# Patient Record
Sex: Female | Born: 1980 | Hispanic: No | Marital: Married | Smoking: Never smoker
Health system: Southern US, Community
[De-identification: ages and names within clinical notes are randomized; demographics above are authoritative.]

## PROBLEM LIST (undated history)

## (undated) DIAGNOSIS — Z789 Other specified health status: Secondary | ICD-10-CM

## (undated) DIAGNOSIS — G43909 Migraine, unspecified, not intractable, without status migrainosus: Secondary | ICD-10-CM

## (undated) DIAGNOSIS — I8390 Asymptomatic varicose veins of unspecified lower extremity: Secondary | ICD-10-CM

## (undated) DIAGNOSIS — R87629 Unspecified abnormal cytological findings in specimens from vagina: Secondary | ICD-10-CM

## (undated) HISTORY — DX: Migraine, unspecified, not intractable, without status migrainosus: G43.909

## (undated) HISTORY — PX: NO PAST SURGERIES: SHX2092

---

## 2007-09-18 ENCOUNTER — Emergency Department (HOSPITAL_COMMUNITY): Admission: EM | Admit: 2007-09-18 | Discharge: 2007-09-18 | Payer: Self-pay | Admitting: Emergency Medicine

## 2008-03-17 ENCOUNTER — Other Ambulatory Visit: Admission: RE | Admit: 2008-03-17 | Discharge: 2008-03-17 | Payer: Self-pay | Admitting: Family Medicine

## 2009-04-22 ENCOUNTER — Other Ambulatory Visit: Admission: RE | Admit: 2009-04-22 | Discharge: 2009-04-22 | Payer: Self-pay | Admitting: Family Medicine

## 2009-09-03 ENCOUNTER — Emergency Department (HOSPITAL_COMMUNITY): Admission: EM | Admit: 2009-09-03 | Discharge: 2009-09-03 | Payer: Self-pay | Admitting: Family Medicine

## 2010-02-18 ENCOUNTER — Emergency Department (HOSPITAL_COMMUNITY)
Admission: EM | Admit: 2010-02-18 | Discharge: 2010-02-18 | Payer: Self-pay | Source: Home / Self Care | Admitting: Family Medicine

## 2010-02-19 ENCOUNTER — Emergency Department (HOSPITAL_COMMUNITY)
Admission: EM | Admit: 2010-02-19 | Discharge: 2010-02-19 | Payer: Self-pay | Source: Home / Self Care | Admitting: Emergency Medicine

## 2012-05-20 ENCOUNTER — Other Ambulatory Visit (HOSPITAL_COMMUNITY): Payer: Self-pay | Admitting: Interventional Radiology

## 2012-05-20 DIAGNOSIS — I83813 Varicose veins of bilateral lower extremities with pain: Secondary | ICD-10-CM

## 2012-05-29 ENCOUNTER — Other Ambulatory Visit: Payer: Self-pay

## 2012-06-17 ENCOUNTER — Ambulatory Visit
Admission: RE | Admit: 2012-06-17 | Discharge: 2012-06-17 | Disposition: A | Discharge status: Home / Self Care | Payer: 59 | Source: Ambulatory Visit | Attending: Interventional Radiology | Admitting: Interventional Radiology

## 2012-06-17 DIAGNOSIS — I83813 Varicose veins of bilateral lower extremities with pain: Secondary | ICD-10-CM

## 2012-06-17 HISTORY — DX: Asymptomatic varicose veins of unspecified lower extremity: I83.90

## 2013-07-28 ENCOUNTER — Other Ambulatory Visit: Payer: Self-pay | Admitting: Family Medicine

## 2013-07-28 ENCOUNTER — Other Ambulatory Visit (HOSPITAL_COMMUNITY)
Admission: RE | Admit: 2013-07-28 | Discharge: 2013-07-28 | Disposition: A | Discharge status: Home / Self Care | Payer: 59 | Source: Ambulatory Visit | Attending: Family Medicine | Admitting: Family Medicine

## 2013-07-28 DIAGNOSIS — Z01419 Encounter for gynecological examination (general) (routine) without abnormal findings: Secondary | ICD-10-CM | POA: Insufficient documentation

## 2013-07-29 LAB — CYTOLOGY - PAP

## 2013-10-09 ENCOUNTER — Other Ambulatory Visit: Payer: Self-pay | Admitting: Family Medicine

## 2013-10-09 ENCOUNTER — Ambulatory Visit
Admission: RE | Admit: 2013-10-09 | Discharge: 2013-10-09 | Disposition: A | Discharge status: Home / Self Care | Payer: 59 | Source: Ambulatory Visit | Attending: Family Medicine | Admitting: Family Medicine

## 2013-10-09 DIAGNOSIS — M25552 Pain in left hip: Secondary | ICD-10-CM

## 2014-05-15 IMAGING — US US RADIOLOGIST EVAL AND MGMT
1 series · 12 of 16 positions shown · non-contrast
Comparison: none

NEW PATIENT OFFICE VISIT - LEVEL III ([id number])

Chief Complaint:  Bilateral leg pain and swelling
HISTORY: Victorio Tafolla is a 31-year-old G1P1 female with an
approximately 10-year history of progressive bilateral lower
extremity pain, swelling and superficial venous changes.  She has
been wearing thigh-high compression hose since [id number] when her
symptoms worsened after having her first child.  She was evaluated
several years ago (March 2008) by [facility]. Per her prior notes, she was previously found to have
no significant insufficiency in the great or small saphenous veins
and no significant incompetent perforators.

[Series 1: us radiologist eval and mgmt · 12 of 70 slices shown]
[im 1/70]
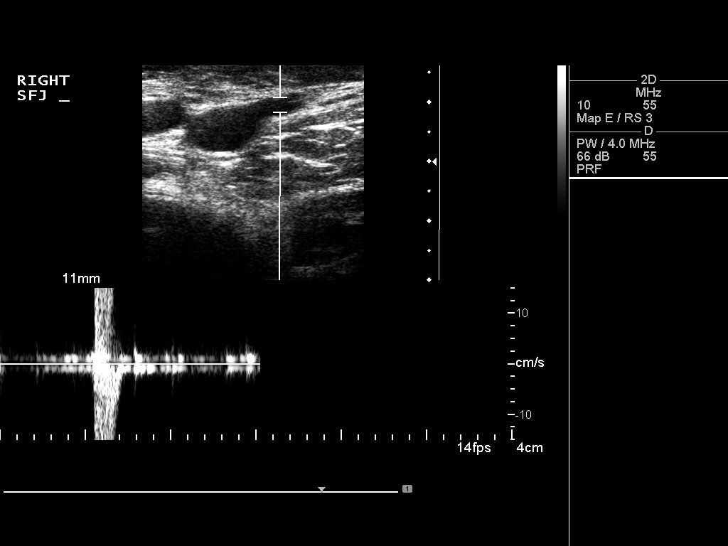
[im 10/70]
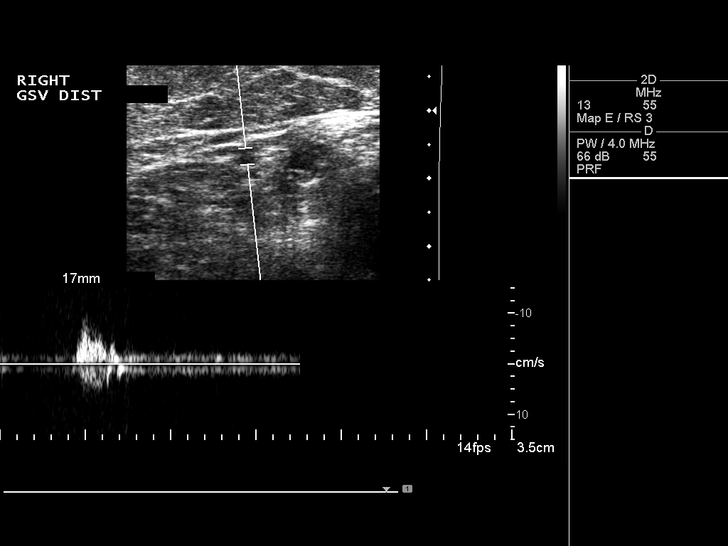
[im 14/70]
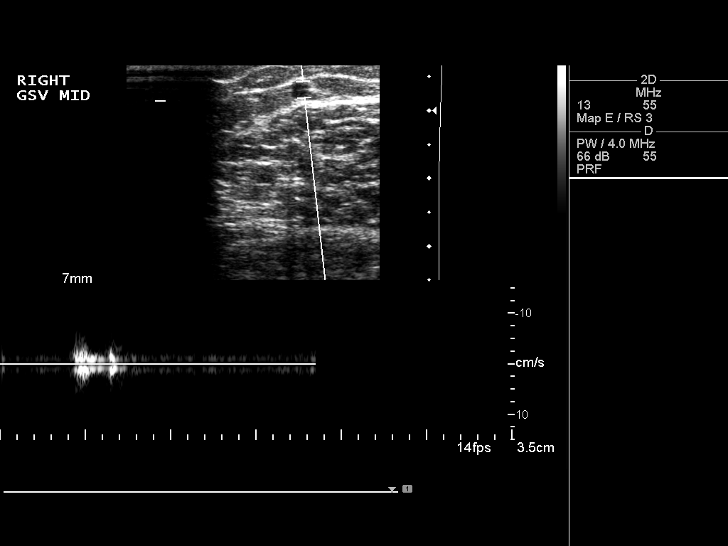
[im 19/70]
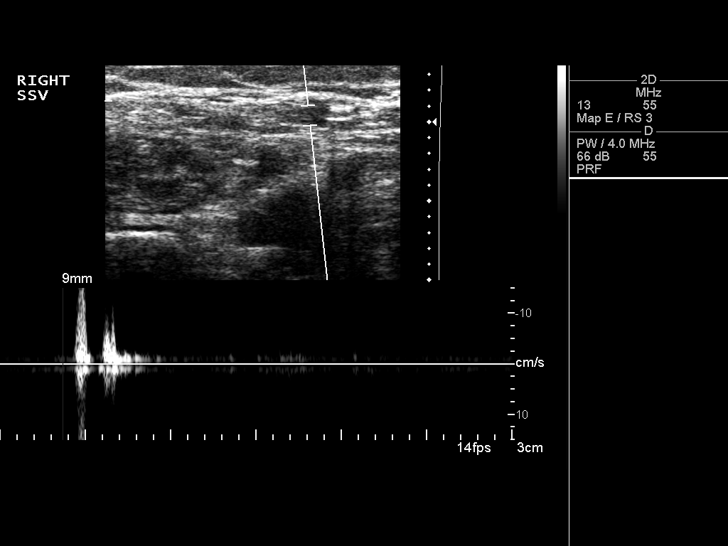
[im 28/70]
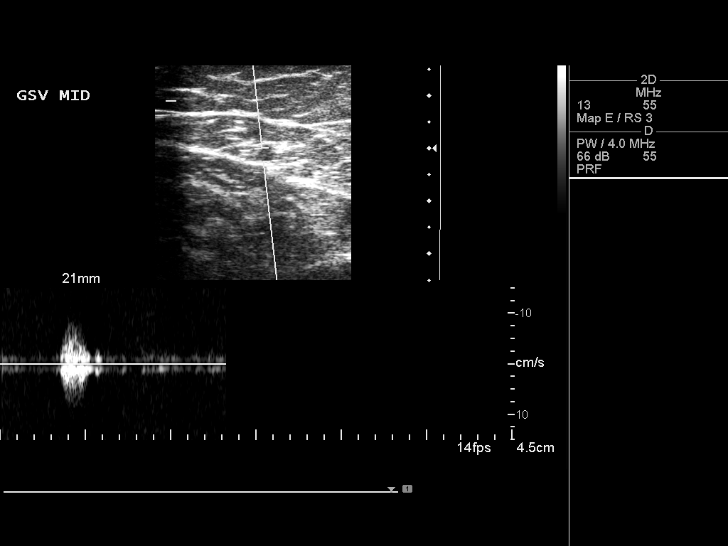
[im 33/70]
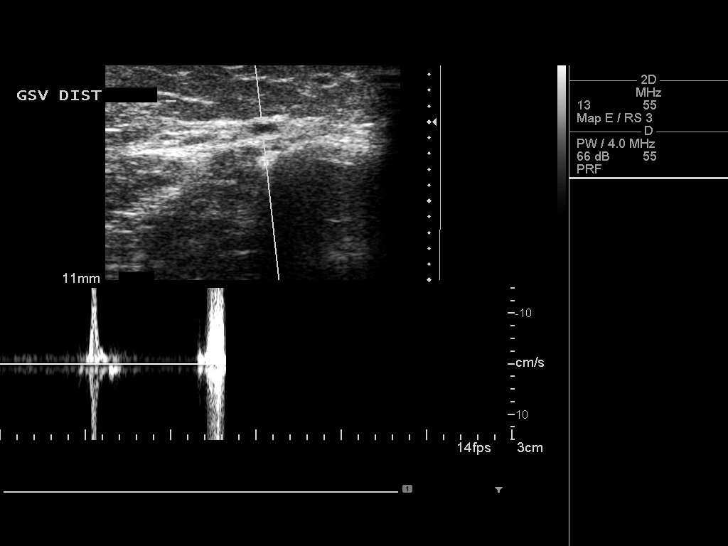
[im 37/70]
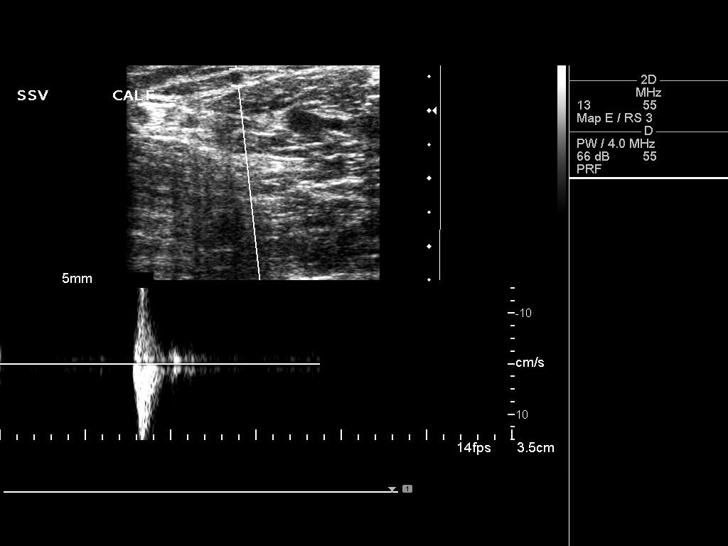
[im 47/70]
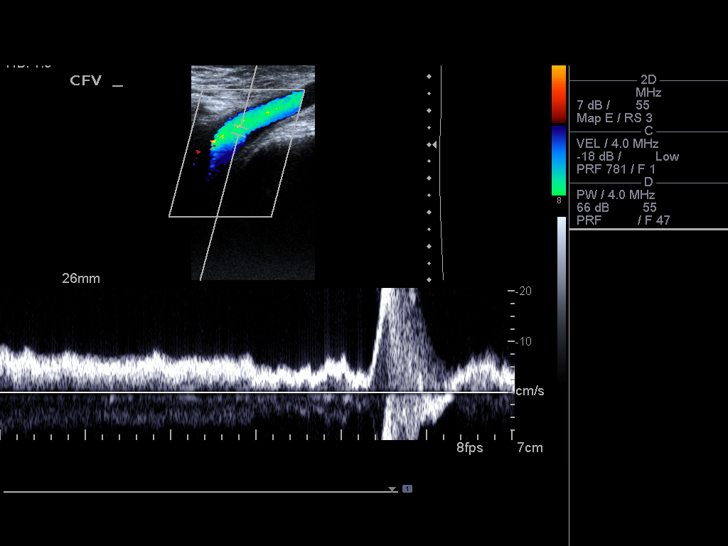
[im 51/70]
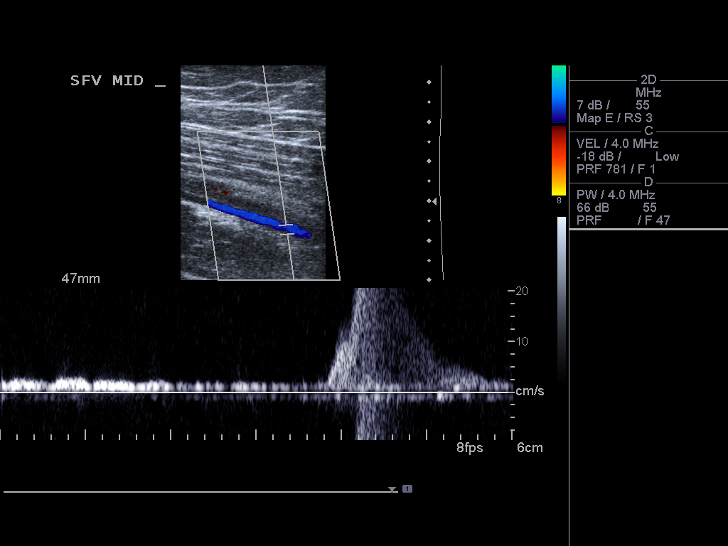
[im 56/70]
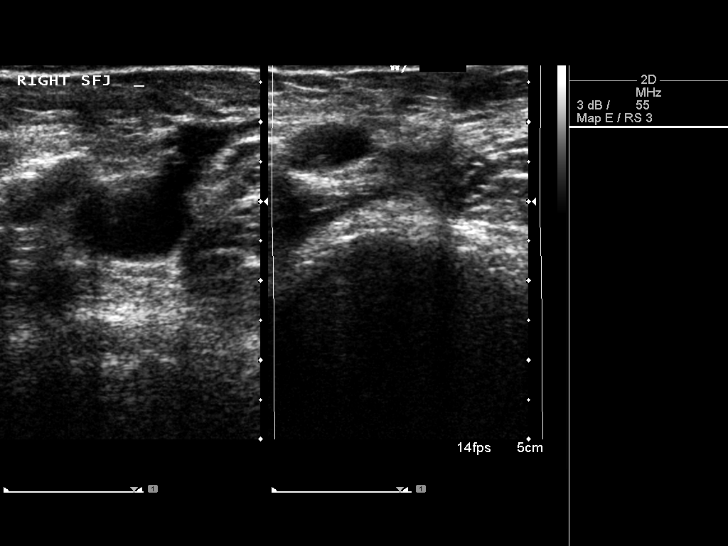
[im 65/70]
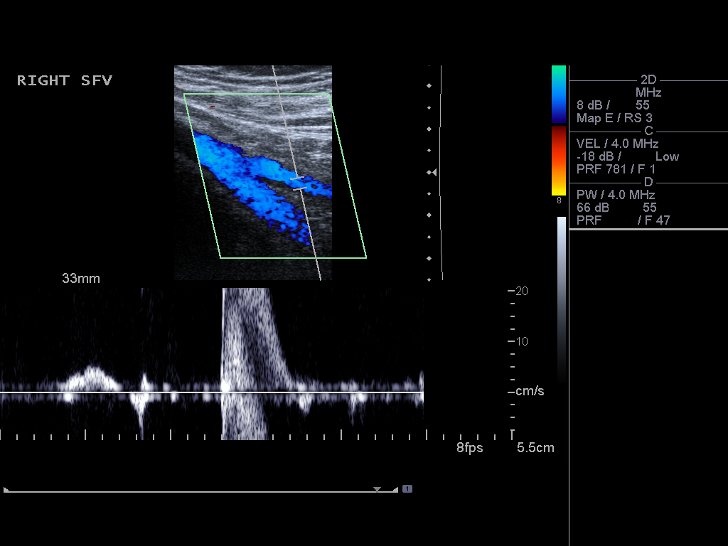
[im 70/70]
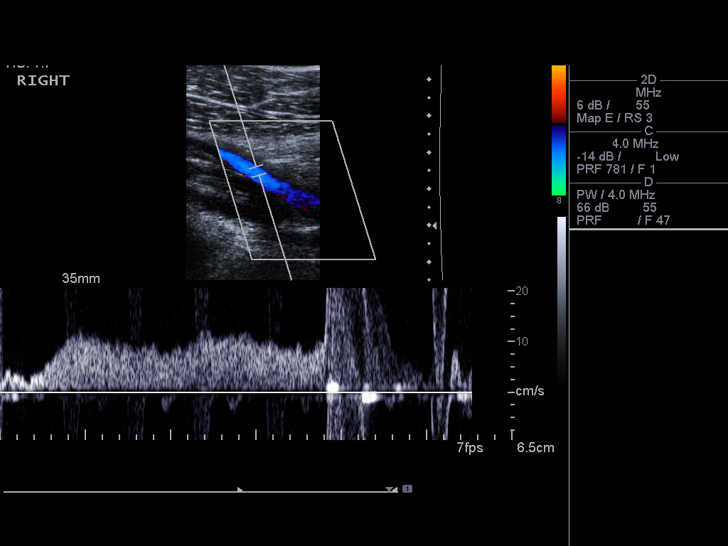

[12 of 16 positions shown; findings below may reference images not displayed]

She presents today for reevaluation.  Her symptoms have slightly
progressed despite daily use of 30-40 mmHg compression hose and
elevation of the lower extremities at the end of the day.  She
describes intermittent sharp stinging pains which blossom into a
burning/aching sensation.  This is accompanied by feelings of
heaviness and fatigue. She reports occasional mild edema at the
level of the ankles.  Her symptoms are localized to the posterior
knee and lower legs, and are worse near the end of the day,
particularly after prolonged periods of standing or activity.  She
takes 800 mg of ibuprofen which she takes twice a day at least two
to three times per week. The ibuprofen provides some degree of
relief.  She finds the compression hose to be minimally effective
at relieving her symptoms. She denies any additional prior venous
directed therapy.  She denies symptoms of pelvic congestion.

Past Medical History:  No significant past medical history

Medications:  Ibuprofen 800 mg twice a day taken 2-3 days per week

Allergies:  Augmentin, codeine, azithromycin

Social History:  Single G1,P1 female.  She currently lives in
[HOSPITAL] and works at the [REDACTED] [employer].  She drinks approximately two glasses of red wine weekly
and denies use of tobacco products or recreational drugs.  She does
not exercise as often as she would like due to her lower extremity
symptoms.

Family History: Her grandmother has a history of a varicose and
spider veins.

Review of Systems:  Pertinent positives listed in the history of
present illness above.  Otherwise, the 14 point review of systems
was negative.

Exam:

[DATE], heart rate 72, respiratory rate 14, temp 97.9 degrees
Fahrenheit, oxygen saturation 100% on room air
General:  Well-nourished well developed female in no acute distress
Neuro:  Alert oriented x3, normal affect
Cardiac:  Regular rate rhythm
Lower extremities:  2+ symmetric dorsalis pedis and posterior
tibial arterial pulses bilaterally.  Mild to moderate density of
clusters of spider veins along the anterior and lateral aspects of
her bilateral lower legs with more subtle changes in the distal
thigh. She has a single visible 3 - 4 mm reticular vein in the
right popliteal fossa. The areas of abnormal veins are minimally
tender to palpation. Trace edema bilaterally at the ankles. Duplex
ultrasonography demonstrates no significant venous insufficiency in
the crater or small saphenous veins bilaterally and no significant
incompetent perforator veins.  She has a vein of Bony on the
left which is competent.

Assessment and Plan:

31-year-old female with a long history of progressive lower
extremity pain, fatigue and mild the swelling associated with
superficial venous changes including spider veins and a prominent
reticular vein.  Her symptoms have been resistant to conventional
noninvasive therapies including bilateral thigh-high compression
hose, elevation and over-the-counter anti-inflammatories. Duplex
ultrasonography demonstrates no significant venous insufficiency in
the great or small saphenous veins bilaterally and no evidence of
incompetent perforator veins.

The degree of her symptoms is greater than expected for her
relatively mild venous changes in the absence of significant deep
or superficial reflux by duplex venous ultrasound.  While I could
certainly offer her visually directed sclerotherapy treatment for
her reticular and spider veins, I suspect that this would result in
a cosmetic improvement and may not relieve her significant
underlying symptoms.

I would like to send her to see Dr. Amazigh, the regional expert
in superficial venous disease, for a second opinion.

[REDACTED]

## 2014-08-20 LAB — OB RESULTS CONSOLE GC/CHLAMYDIA
Chlamydia: NEGATIVE
GC PROBE AMP, GENITAL: NEGATIVE

## 2014-08-20 LAB — OB RESULTS CONSOLE ABO/RH: RH Type: POSITIVE

## 2014-08-20 LAB — OB RESULTS CONSOLE RPR: RPR: NONREACTIVE

## 2014-08-20 LAB — OB RESULTS CONSOLE ANTIBODY SCREEN: ANTIBODY SCREEN: NEGATIVE

## 2014-08-20 LAB — OB RESULTS CONSOLE HEPATITIS B SURFACE ANTIGEN: Hepatitis B Surface Ag: NEGATIVE

## 2014-08-20 LAB — OB RESULTS CONSOLE HIV ANTIBODY (ROUTINE TESTING): HIV: NONREACTIVE

## 2014-08-20 LAB — OB RESULTS CONSOLE RUBELLA ANTIBODY, IGM: RUBELLA: IMMUNE

## 2015-03-03 LAB — OB RESULTS CONSOLE GBS: STREP GROUP B AG: POSITIVE

## 2015-03-18 ENCOUNTER — Encounter (HOSPITAL_COMMUNITY): Payer: Self-pay | Admitting: *Deleted

## 2015-03-18 ENCOUNTER — Telehealth (HOSPITAL_COMMUNITY): Payer: Self-pay | Admitting: *Deleted

## 2015-03-18 NOTE — Telephone Encounter (Signed)
Preadmission screen  

## 2015-03-25 ENCOUNTER — Inpatient Hospital Stay (HOSPITAL_COMMUNITY): Payer: 59 | Admitting: Anesthesiology

## 2015-03-25 ENCOUNTER — Encounter (HOSPITAL_COMMUNITY): Payer: Self-pay

## 2015-03-25 ENCOUNTER — Inpatient Hospital Stay (HOSPITAL_COMMUNITY)
Admission: RE | Admit: 2015-03-25 | Discharge: 2015-03-26 | DRG: 774 | Disposition: A | Discharge status: Home / Self Care | Payer: 59 | Source: Ambulatory Visit | Attending: Obstetrics and Gynecology | Admitting: Obstetrics and Gynecology

## 2015-03-25 DIAGNOSIS — O99214 Obesity complicating childbirth: Secondary | ICD-10-CM | POA: Diagnosis present

## 2015-03-25 DIAGNOSIS — Z3A39 39 weeks gestation of pregnancy: Secondary | ICD-10-CM | POA: Diagnosis not present

## 2015-03-25 DIAGNOSIS — E669 Obesity, unspecified: Secondary | ICD-10-CM | POA: Diagnosis present

## 2015-03-25 DIAGNOSIS — O3663X0 Maternal care for excessive fetal growth, third trimester, not applicable or unspecified: Secondary | ICD-10-CM | POA: Diagnosis present

## 2015-03-25 DIAGNOSIS — O9832 Other infections with a predominantly sexual mode of transmission complicating childbirth: Secondary | ICD-10-CM | POA: Diagnosis present

## 2015-03-25 DIAGNOSIS — A6000 Herpesviral infection of urogenital system, unspecified: Secondary | ICD-10-CM | POA: Diagnosis present

## 2015-03-25 DIAGNOSIS — Z3490 Encounter for supervision of normal pregnancy, unspecified, unspecified trimester: Secondary | ICD-10-CM

## 2015-03-25 DIAGNOSIS — Z6832 Body mass index (BMI) 32.0-32.9, adult: Secondary | ICD-10-CM | POA: Diagnosis not present

## 2015-03-25 HISTORY — DX: Other specified health status: Z78.9

## 2015-03-25 HISTORY — DX: Unspecified abnormal cytological findings in specimens from vagina: R87.629

## 2015-03-25 LAB — CBC
HCT: 35.6 % — ABNORMAL LOW (ref 36.0–46.0)
Hemoglobin: 11.9 g/dL — ABNORMAL LOW (ref 12.0–15.0)
MCH: 27.1 pg (ref 26.0–34.0)
MCHC: 33.4 g/dL (ref 30.0–36.0)
MCV: 81.1 fL (ref 78.0–100.0)
PLATELETS: 298 10*3/uL (ref 150–400)
RBC: 4.39 MIL/uL (ref 3.87–5.11)
RDW: 16.2 % — AB (ref 11.5–15.5)
WBC: 7.7 10*3/uL (ref 4.0–10.5)

## 2015-03-25 LAB — TYPE AND SCREEN
ABO/RH(D): A POS
Antibody Screen: NEGATIVE

## 2015-03-25 LAB — ABO/RH: ABO/RH(D): A POS

## 2015-03-25 MED ORDER — TRAMADOL HCL 50 MG PO TABS: 50.0000 mg | ORAL_TABLET | Freq: Four times a day (QID) | ORAL | Status: DC | PRN

## 2015-03-25 MED ORDER — FLEET ENEMA 7-19 GM/118ML RE ENEM: 1.0000 | ENEMA | RECTAL | Status: DC | PRN

## 2015-03-25 MED ORDER — BENZOCAINE-MENTHOL 20-0.5 % EX AERO: 1.0000 "application " | INHALATION_SPRAY | CUTANEOUS | Status: DC | PRN

## 2015-03-25 MED ORDER — SENNOSIDES-DOCUSATE SODIUM 8.6-50 MG PO TABS
2.0000 | ORAL_TABLET | ORAL | Status: DC
  Administered 2015-03-26: 2 via ORAL
  Filled 2015-03-25: qty 2

## 2015-03-25 MED ORDER — CITRIC ACID-SODIUM CITRATE 334-500 MG/5ML PO SOLN: 30.0000 mL | ORAL | Status: DC | PRN

## 2015-03-25 MED ORDER — ONDANSETRON HCL 4 MG/2ML IJ SOLN
4.0000 mg | INTRAMUSCULAR | Status: DC | PRN
Start: 2015-03-25 — End: 2015-03-26

## 2015-03-25 MED ORDER — PENICILLIN G POTASSIUM 5000000 UNITS IJ SOLR
2.5000 10*6.[IU] | INTRAVENOUS | Status: DC
  Administered 2015-03-25: 2.5 10*6.[IU] via INTRAVENOUS
  Filled 2015-03-25 (×4): qty 2.5

## 2015-03-25 MED ORDER — LACTATED RINGERS IV SOLN: INTRAVENOUS | Status: DC

## 2015-03-25 MED ORDER — LACTATED RINGERS IV SOLN: 500.0000 mL | INTRAVENOUS | Status: DC | PRN

## 2015-03-25 MED ORDER — EPHEDRINE 5 MG/ML INJ
10.0000 mg | INTRAVENOUS | Status: DC | PRN
  Filled 2015-03-25: qty 2

## 2015-03-25 MED ORDER — DIBUCAINE 1 % RE OINT
1.0000 | TOPICAL_OINTMENT | RECTAL | Status: DC | PRN
Start: 2015-03-25 — End: 2015-03-26

## 2015-03-25 MED ORDER — OXYTOCIN 10 UNIT/ML IJ SOLN
1.0000 m[IU]/min | INTRAVENOUS | Status: DC
  Administered 2015-03-25: 2 m[IU]/min via INTRAVENOUS
  Filled 2015-03-25: qty 4

## 2015-03-25 MED ORDER — IBUPROFEN 600 MG PO TABS
600.0000 mg | ORAL_TABLET | Freq: Four times a day (QID) | ORAL | Status: DC
  Administered 2015-03-26 (×3): 600 mg via ORAL
  Filled 2015-03-25 (×3): qty 1

## 2015-03-25 MED ORDER — OXYTOCIN 10 UNIT/ML IJ SOLN: 2.5000 [IU]/h | INTRAVENOUS | Status: DC

## 2015-03-25 MED ORDER — DIPHENHYDRAMINE HCL 25 MG PO CAPS: 25.0000 mg | ORAL_CAPSULE | Freq: Four times a day (QID) | ORAL | Status: DC | PRN

## 2015-03-25 MED ORDER — PHENYLEPHRINE 40 MCG/ML (10ML) SYRINGE FOR IV PUSH (FOR BLOOD PRESSURE SUPPORT)
80.0000 ug | PREFILLED_SYRINGE | INTRAVENOUS | Status: DC | PRN
  Filled 2015-03-25: qty 2
  Filled 2015-03-25: qty 20

## 2015-03-25 MED ORDER — LIDOCAINE HCL (PF) 1 % IJ SOLN
30.0000 mL | INTRAMUSCULAR | Status: DC | PRN
  Filled 2015-03-25: qty 30

## 2015-03-25 MED ORDER — LACTATED RINGERS IV SOLN: 500.0000 mL | Freq: Once | INTRAVENOUS | Status: DC

## 2015-03-25 MED ORDER — BUTORPHANOL TARTRATE 1 MG/ML IJ SOLN: 1.0000 mg | INTRAMUSCULAR | Status: DC | PRN

## 2015-03-25 MED ORDER — FENTANYL 2.5 MCG/ML BUPIVACAINE 1/10 % EPIDURAL INFUSION (WH - ANES)
14.0000 mL/h | INTRAMUSCULAR | Status: DC | PRN
  Administered 2015-03-25: 14 mL/h via EPIDURAL
  Filled 2015-03-25: qty 125

## 2015-03-25 MED ORDER — OXYTOCIN BOLUS FROM INFUSION: 500.0000 mL | INTRAVENOUS | Status: DC

## 2015-03-25 MED ORDER — ONDANSETRON HCL 4 MG/2ML IJ SOLN: 4.0000 mg | Freq: Four times a day (QID) | INTRAMUSCULAR | Status: DC | PRN

## 2015-03-25 MED ORDER — SIMETHICONE 80 MG PO CHEW: 80.0000 mg | CHEWABLE_TABLET | ORAL | Status: DC | PRN

## 2015-03-25 MED ORDER — TERBUTALINE SULFATE 1 MG/ML IJ SOLN
0.2500 mg | Freq: Once | INTRAMUSCULAR | Status: DC | PRN
Start: 2015-03-25 — End: 2015-03-25
  Filled 2015-03-25: qty 1

## 2015-03-25 MED ORDER — PRENATAL MULTIVITAMIN CH
1.0000 | ORAL_TABLET | Freq: Every day | ORAL | Status: DC
  Administered 2015-03-26: 1 via ORAL
  Filled 2015-03-25: qty 1

## 2015-03-25 MED ORDER — ZOLPIDEM TARTRATE 5 MG PO TABS: 5.0000 mg | ORAL_TABLET | Freq: Every evening | ORAL | Status: DC | PRN

## 2015-03-25 MED ORDER — ACETAMINOPHEN 325 MG PO TABS: 650.0000 mg | ORAL_TABLET | ORAL | Status: DC | PRN

## 2015-03-25 MED ORDER — CLINDAMYCIN PHOSPHATE 900 MG/50ML IV SOLN: 900.0000 mg | Freq: Three times a day (TID) | INTRAVENOUS | Status: DC

## 2015-03-25 MED ORDER — WITCH HAZEL-GLYCERIN EX PADS: 1.0000 "application " | MEDICATED_PAD | CUTANEOUS | Status: DC | PRN

## 2015-03-25 MED ORDER — ONDANSETRON HCL 4 MG PO TABS: 4.0000 mg | ORAL_TABLET | ORAL | Status: DC | PRN

## 2015-03-25 MED ORDER — TETANUS-DIPHTH-ACELL PERTUSSIS 5-2.5-18.5 LF-MCG/0.5 IM SUSP
0.5000 mL | Freq: Once | INTRAMUSCULAR | Status: DC
  Filled 2015-03-25: qty 0.5

## 2015-03-25 MED ORDER — PHENYLEPHRINE 40 MCG/ML (10ML) SYRINGE FOR IV PUSH (FOR BLOOD PRESSURE SUPPORT)
80.0000 ug | PREFILLED_SYRINGE | INTRAVENOUS | Status: DC | PRN
  Filled 2015-03-25: qty 2

## 2015-03-25 MED ORDER — LIDOCAINE HCL (PF) 1 % IJ SOLN
INTRAMUSCULAR | Status: DC | PRN
  Administered 2015-03-25 (×2): 4 mL

## 2015-03-25 MED ORDER — DEXTROSE 5 % IV SOLN
5.0000 10*6.[IU] | Freq: Once | INTRAVENOUS | Status: AC
  Administered 2015-03-25: 5 10*6.[IU] via INTRAVENOUS
  Filled 2015-03-25: qty 5

## 2015-03-25 MED ORDER — LANOLIN HYDROUS EX OINT: TOPICAL_OINTMENT | CUTANEOUS | Status: DC | PRN

## 2015-03-25 MED ORDER — DIPHENHYDRAMINE HCL 50 MG/ML IJ SOLN: 12.5000 mg | INTRAMUSCULAR | Status: DC | PRN

## 2015-03-25 NOTE — Anesthesia Procedure Notes (Signed)
Epidural Patient location during procedure: OB  Staffing Anesthesiologist: Traniya Prichett Performed by: anesthesiologist   Preanesthetic Checklist Completed: patient identified, site marked, surgical consent, pre-op evaluation, timeout performed, IV checked, risks and benefits discussed and monitors and equipment checked  Epidural Patient position: sitting Prep: site prepped and draped and DuraPrep Patient monitoring: continuous pulse ox and blood pressure Approach: midline Location: L3-L4 Injection technique: LOR saline  Needle:  Needle type: Tuohy  Needle gauge: 17 G Needle length: 9 cm and 9 Needle insertion depth: 5 cm cm Catheter type: closed end flexible Catheter size: 19 Gauge Catheter at skin depth: 10 cm Test dose: negative  Assessment Events: blood not aspirated, injection not painful, no injection resistance, negative IV test and no paresthesia  Additional Notes Patient identified. Risks/Benefits/Options discussed with patient including but not limited to bleeding, infection, nerve damage, paralysis, failed block, incomplete pain control, headache, blood pressure changes, nausea, vomiting, reactions to medication both or allergic, itching and postpartum back pain. Confirmed with bedside nurse the patient's most recent platelet count. Confirmed with patient that they are not currently taking any anticoagulation, have any bleeding history or any family history of bleeding disorders. Patient expressed understanding and wished to proceed. All questions were answered. Sterile technique was used throughout the entire procedure. Please see nursing notes for vital signs. Test dose was given through epidural catheter and negative prior to continuing to dose epidural or start infusion. Warning signs of high block given to the patient including shortness of breath, tingling/numbness in hands, complete motor block, or any concerning symptoms with instructions to call for help. Patient was  given instructions on fall risk and not to get out of bed. All questions and concerns addressed with instructions to call with any issues or inadequate analgesia.      

## 2015-03-25 NOTE — Consults (Signed)
  Anesthesia Pain Consult Note  Patient: Samantha Trujillo, 35 y.o., female  Consult Requested by: Harold Hedge, MD  Reason for Consult: Epidural  Rounding  Level of Consciousness: alert  Current Pain Score: 0/10 Goal Score:5/10 in active labor.  Last Vitals:  Filed Vitals:   03/25/15 1102  BP: 140/76  Pulse: 79  Temp: 36.5 C  Resp: 16    Plan: Epidural infusion for pain control.      Allergies  Allergen Reactions  . Augmentin [Amoxicillin-Pot Clavulanate] Other (See Comments)    dizzy  . Codeine Rash  . Zithromax [Azithromycin] Rash     I have reviewed the patient's medications listed below.  . lactated ringers  500 mL Intravenous Once  . pencillin G potassium IV  5 Million Units Intravenous Once   Followed by  . pencillin G potassium IV  2.5 Million Units Intravenous Q4H   Pending:: . fentaNYL 2.5 mcg/ml w/bupivacaine 1/10% in NS epidural infusion ( total)    . lactated ringers    . oxytocin    . oxytocin 2 milli-units/min (03/25/15 1103)  . oxytocin 40 units in LR 1000 mL     acetaminophen, butorphanol, citric acid-sodium citrate, diphenhydrAMINE, ePHEDrine, ePHEDrine, fentaNYL 2.5 mcg/ml w/bupivacaine 1/10% in NS epidural infusion ( total), lactated ringers, lidocaine (PF), ondansetron, phenylephrine, phenylephrine, sodium phosphate, terbutaline  Past Medical History  Diagnosis Date  . Varicose veins    Past Surgical History  Procedure Laterality Date  . No past surgeries      has no tobacco, alcohol, and drug history on file.    Fulton Merry 03/25/2015

## 2015-03-25 NOTE — Anesthesia Preprocedure Evaluation (Signed)

## 2015-03-25 NOTE — H&P (Signed)
Samantha Trujillo is a 35 y.o. female presenting for IOL with EFW 8# 11oz on 03/16/15. 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 Maternal Medical History:  Fetal activity: Perceived fetal activity is normal.      OB History    Gravida Para Term Preterm AB TAB SAB Ectopic Multiple Living   1              Past Medical History  Diagnosis Date  . Varicose veins   . Vaginal Pap smear, abnormal   . Medical history non-contributory    Past Surgical History  Procedure Laterality Date  . No past surgeries     Family History: family history is not on file. Social History:  reports that she has never smoked. She does not have any smokeless tobacco history on file. She reports that she does not drink alcohol or use illicit drugs.   Prenatal Transfer Tool  Maternal Diabetes: No Genetic Screening: Normal Maternal Ultrasounds/Referrals: Normal Fetal Ultrasounds or other Referrals:  None Maternal Substance Abuse:  No Significant Maternal Medications:  Meds include: Other: Valtrex Significant Maternal Lab Results:  None Other Comments:  None  Review of Systems  Eyes: Negative for blurred vision.  Gastrointestinal: Negative for abdominal pain.  Neurological: Negative for headaches.    Dilation: 3.5 Effacement (%): 50 Station: -2 Exam by:: Alazne Quant Blood pressure 138/83, pulse 68, temperature 97.7 F (36.5 C), temperature source Axillary, resp. rate 16, last menstrual period 06/25/2014. Maternal Exam:  Abdomen: Fetal presentation: vertex     Fetal Exam Fetal State Assessment: Category I - tracings are normal.     Physical Exam  Cardiovascular: Normal rate and regular rhythm.   Respiratory: Effort normal and breath sounds normal.  GI: Soft. There is no tenderness.  Neurological: She has normal reflexes.    Cx 3/80/-2/vtx AROM clear  Prenatal labs: ABO, Rh: --/--/A POS (02/24 1030) Antibody: NEG (02/24 1030) Rubella:  Immune (07/22 0000) RPR: Nonreactive (07/22 0000)  HBsAg: Negative (07/22 0000)  HIV: Non-reactive (07/22 0000)  GBS: Positive (02/02 0000)   Assessment/Plan: 35 yo with LGA D/W patient active phase labor Epidural prn   Patric Buckhalter II,Fatema Rabe E 03/25/2015, 12:54 PM

## 2015-03-25 NOTE — Progress Notes (Signed)
4/90/-1 UCs q2-3 min Epidural in FHT decreased accels  FSE and IUPC placed Good scalp stim with FSE

## 2015-03-25 NOTE — Progress Notes (Signed)
Delivery Note At 6:32 PM a viable female was delivered via Vaginal, Spontaneous Delivery (Presentation: ;  ).  APGAR: 9, 9; weight  .   Placenta status: Intact, Spontaneous.  Cord: 3 vessels with the following complications: None.  Cord pH: not done  Anesthesia: Epidural  Episiotomy: None Lacerations: 2nd degree Suture Repair: 2.0 vicryl rapide Est. Blood Loss (mL): 100  Mom to postpartum.  Baby to Couplet care / Skin to Skin.  Montasia Chisenhall II,Rahaf Carbonell E 03/25/2015, 6:44 PM

## 2015-03-26 ENCOUNTER — Ambulatory Visit: Payer: Self-pay

## 2015-03-26 LAB — CBC
HEMATOCRIT: 32.7 % — AB (ref 36.0–46.0)
HEMOGLOBIN: 10.9 g/dL — AB (ref 12.0–15.0)
MCH: 27.3 pg (ref 26.0–34.0)
MCHC: 33.3 g/dL (ref 30.0–36.0)
MCV: 82 fL (ref 78.0–100.0)
Platelets: 249 10*3/uL (ref 150–400)
RBC: 3.99 MIL/uL (ref 3.87–5.11)
RDW: 16.2 % — ABNORMAL HIGH (ref 11.5–15.5)
WBC: 10.9 10*3/uL — AB (ref 4.0–10.5)

## 2015-03-26 LAB — RPR: RPR Ser Ql: NONREACTIVE

## 2015-03-26 MED ORDER — IBUPROFEN 600 MG PO TABS: 600.0000 mg | ORAL_TABLET | Freq: Four times a day (QID) | ORAL | Status: DC | PRN

## 2015-03-26 MED ORDER — BENZOCAINE-MENTHOL 20-0.5 % EX AERO: 1.0000 "application " | INHALATION_SPRAY | Freq: Three times a day (TID) | CUTANEOUS | Status: DC | PRN

## 2015-03-26 NOTE — Anesthesia Postprocedure Evaluation (Signed)
Anesthesia Post Note  Patient: Samantha Trujillo  Procedure(s) Performed: * No procedures listed *  Patient location during evaluation: Mother Baby Anesthesia Type: Epidural Level of consciousness: awake and alert and oriented Pain management: satisfactory to patient Vital Signs Assessment: post-procedure vital signs reviewed and stable Respiratory status: spontaneous breathing and nonlabored ventilation Cardiovascular status: stable Postop Assessment: no headache, no backache, no signs of nausea or vomiting, adequate PO intake and patient able to bend at knees (patient up walking) Anesthetic complications: no    Last Vitals:  Filed Vitals:   03/25/15 2127 03/26/15 0107  BP: 138/70 132/70  Pulse: 89 78  Temp: 36.9 C 36.8 C  Resp: 18 18    Last Pain:  Filed Vitals:   03/26/15 0532  PainSc: 0-No pain                 Aailyah Dunbar

## 2015-03-26 NOTE — Discharge Summary (Signed)
Obstetric Discharge Summary Reason for Admission: induction of labor Prenatal Procedures: none Intrapartum Procedures: spontaneous vaginal delivery Postpartum Procedures: none Complications-Operative and Postpartum: none HEMOGLOBIN  Date Value Ref Range Status  03/26/2015 10.9* 12.0 - 15.0 g/dL Final   HCT  Date Value Ref Range Status  03/26/2015 32.7* 36.0 - 46.0 % Final    Physical Exam:  General: alert, cooperative and no distress Lochia: appropriate Uterine Fundus: firm Incision: healing well DVT Evaluation: No evidence of DVT seen on physical exam.  Discharge Diagnoses: Term Pregnancy-delivered  Discharge Information: Date: 03/26/2015 Activity: pelvic rest Diet: routine Medications: PNV Condition: stable Instructions: refer to practice specific booklet Discharge to: home   Newborn Data: Live born female  Birth Weight: 8 lb 8.9 oz (3880 g) APGAR: 9, 9  Home with mother.  Lorik Guo II,Quetzalli Clos E 03/26/2015, 8:35 AM

## 2015-03-26 NOTE — Lactation Note (Signed)
This note was copied from a baby's chart. Lactation Consultation Note  Patient Name: Samantha Trujillo [id number] Date: 03/26/2015 Reason for consult: Follow-up assessment;Other (Comment)  Mom called for [facility] assist . Prior to latching baby spitty , clear thin mucous, LC used bulb syringe.  Baby checked diaper, dry , baby passing large amount gas.  LC noted when baby crying , spitting , a high palate , short anterior frenulum with indentation in baby's tongue. LC did not mention the short anterior frenulum to the parents, just the high palate for latching.  LC explained hand expressing and then showed mom how to hand express with few drops of colostrum noted.  LC noted a short shaft nipple, compressible areolo. With firm support, using the football position latched the baby  And breast compressions , a few swallows noted and strong sucking pattern .  For approx and mom asked to remove baby due to the latch being to painful.  LC explained option :  Recommendations -   1st option latching skin to skin and it was to painful  2nd option would be trying a NS - mom receptive , ( #20 NS fit the best compared to the #24 NS ),  Baby latched in football position for approx 1 min and per mom please take her off.  3rd option - pump using the DEBP both breast 15-20 mins every 2-3 hours and mom receptive.  LC set up the DEBP and explained it to mom and turned it on the one drop in the window of the pump and mom  asked to take it off to painful ( #24 Flange gave mom plenty of room and noted to be a good fit ). LC mentioned to mom if dad coconut oil or olive oil on the flange would decrease the friction of the pump and  Soften the tissue for pumping. LC suggested dad  Go to the store to purchase some.  LC instructed mom on the use comfort gels.  LC reassured mom  The staff is here to support her how ever she chooses to feed her baby.  LC recommended if the baby gets spitty and choky and  using the  bulb syringe and burping doesn't resolve it to use the emergency light on the nurses light.  [facility] RN Rodrigo Ran aware of the results of the consult and aware the baby had been very spitty during consult.        Maternal Data Has patient been taught Hand Expression?: Yes (small drops of colostrum noted ) Does the patient have breastfeeding experience prior to this delivery?: Yes  Feeding Feeding Type: Breast Fed Length of feed: 1 min (per mom please take her  off to painful )  LATCH Score/Interventions Latch: Grasps breast easily, tongue down, lips flanged, rhythmical sucking.  Audible Swallowing: A few with stimulation  Type of Nipple: Everted at rest and after stimulation (short shaft nipple )  Comfort (Breast/Nipple): Soft / non-tender     Hold (Positioning): Full assist, staff holds infant at breast Intervention(s): Breastfeeding basics reviewed  LATCH Score: 7  Lactation Tools Discussed/Used Tools: Pump Nipple shield size: 20;24;Other (comment) (#20 NS was a better, ) Breast pump type: Double-Electric Breast Pump (see note )   Consult Status Consult Status: Follow-up Date: 03/27/15 Follow-up type: In-patient    Kathrin Greathouse 03/26/2015, 7:08 PM

## 2015-03-26 NOTE — Lactation Note (Signed)
This note was copied from a baby's chart. Lactation Consultation Note  Patient Name: Samantha Trujillo [id number] Date: 03/26/2015 Per mom the baby last fed at 0930 for 30 mins , presently skin to skin with mom and not showing feeding cues.  Baby is 74 hours old and has been to the breast with breast range 8 - 35 mins. Voiding and stooling.  LC updated doc flow sheets and asked mom to call with feeding cues.  Mother informed of post-discharge support and given phone number to the lactation department, including services for phone call assistance; out-patient appointments; and breastfeeding support group. List of other breastfeeding resources in the community given in the handout. Encouraged mother to call for problems or concerns related to breastfeeding.  Maternal Data    Feeding    LATCH Score/Interventions                      Lactation Tools Discussed/Used     Consult Status Consult Status: Follow-up Date: 03/26/15 Follow-up type: In-patient    Kathrin Greathouse 03/26/2015, 1:22 PM

## 2015-03-26 NOTE — Progress Notes (Signed)
Post Partum Day 1 Subjective: no complaints, up ad lib, voiding, tolerating PO and + flatus Wants to go home today Objective: Blood pressure 132/70, pulse 78, temperature 98.3 F (36.8 C), temperature source Oral, resp. rate 18, height  (1.676 m), weight 200 lb (90.719 kg), last menstrual period 06/25/2014, SpO2 98 %, unknown if currently breastfeeding.  Physical Exam:  General: alert, cooperative and no distress Lochia: appropriate Uterine Fundus: firm Incision: healing well DVT Evaluation: No evidence of DVT seen on physical exam.   Recent Labs  03/25/15 1030 03/26/15 0548  HGB 11.9* 10.9*  HCT 35.6* 32.7*    Assessment/Plan: Discharge home   LOS: 1 day   Samantha Trujillo,Coleman Kalas E 03/26/2015, 8:32 AM

## 2015-03-28 ENCOUNTER — Inpatient Hospital Stay (HOSPITAL_COMMUNITY): Admission: RE | Admit: 2015-03-28 | Payer: Self-pay | Source: Ambulatory Visit

## 2015-04-04 ENCOUNTER — Ambulatory Visit (HOSPITAL_COMMUNITY)
Admission: RE | Admit: 2015-04-04 | Discharge: 2015-04-04 | Disposition: A | Discharge status: Home / Self Care | Payer: 59 | Source: Ambulatory Visit | Attending: Obstetrics and Gynecology | Admitting: Obstetrics and Gynecology

## 2015-04-04 NOTE — Lactation Note (Signed)
Lactation Consult  WEIGHT today 8#15.1 oz  Mother's reason for visit:  Baby is not latching. Visit Type:  OP Appointment Notes: Breastfeeding has not gotten off to a good start.  Mom reports pain with BF and she does not like to pump.  Baby is 9 days of life and the breast is being stimulated 5 times in 24 hours.  Mia is BF 3 times a day for 45 minutes but does not stay satisfied for long. SHe receives a bottle of formula about 8 times in 24 hours and eats 3-4 oz.  Today she attached to the bare breast but did not suckle rhythmically or without stimulation. She was on the breast for 10 minutes and transferred 0 ml.  A nipple shield was applied to see if she become more engaged and she was. However, her total transfer was only 4 ml. She was placed on the right breast with the #20 NS and transferred  12 ml. Patient's breasts were soft after this. Mom has an adequate amount of glandular tissue.  Mia was then fed 30 ml for a bottle using the paced feeding method.Pedro Earls.  Mia is not maintaining a vacuum on the breast or on a gloved finger.  She does not elevate her tongue well and a frenum is noted under the tongue.  It comes to a point when  extension. Mia is able to lateralize her tongue. I suspect that her oral anatomy has contributed to mom's pain and poor transfer.  Plan is to BF on cue, offer supplementation after BF, post-pump 8-10 times in 24 hours for 15-20 minutes.  Parents were given resources to educate themselves on oral restrictions. Weight check by [employer] later this week and weight check recommended weekly while using the nipple shield. Consult:  Initial Lactation Consultant:  Soyla DryerJoseph, Kathaleen Dudziak  ________________________________________________________________________ Joan FloresBaby's Name: Judithann SaugerMia Lilyan Schlereth Date of Birth: 03/25/2015 Pediatrician: [facility] Gender: female Gestational Age: 3775w0d (At Birth) Birth Weight: 8 lb 8.9 oz (3880 g) Weight at Discharge: Weight: 8 lb 2.7 oz (3705  g)[facility]   03/[facility]/15 1832 03/27/15 0109  Weight: 8 lb 8.9 oz (3880 g) 8 lb 2.7 oz (3705 g)    Weight today: 8#15.1oz at 10 days of life      ________________________________________________________________________  Mother's Name: Ezzard FlaxKimberly Bartee Breastfeeding Experience:  First BF experience Maternal Medical Conditions:   Maternal Medications:  Ibuprofen and PNV  ________________________________________________________________________  Breastfeeding History (Post Discharge)  Frequency of breastfeeding:  3 times in 24 hours Duration of feeding:  45 minutes    Infant Intake and Output Assessment  Voids:  8-10 in 24 hrs.  Color:  Clear yellow Stools: 2 in 24 hrs.  Color:  Green and Yellow

## 2015-09-06 IMAGING — CR DG HIP (WITH OR WITHOUT PELVIS) 2-3V*L*
2 series · 2 of 2 positions shown · non-contrast
Comparison: None.

CLINICAL DATA: 32-year-old female with left hip and groin pain x1
week. Painful range of motion. No known injury. Initial encounter.

EXAM:
LEFT HIP - COMPLETE 2+ VIEW

[view not recorded (1 of 2)]
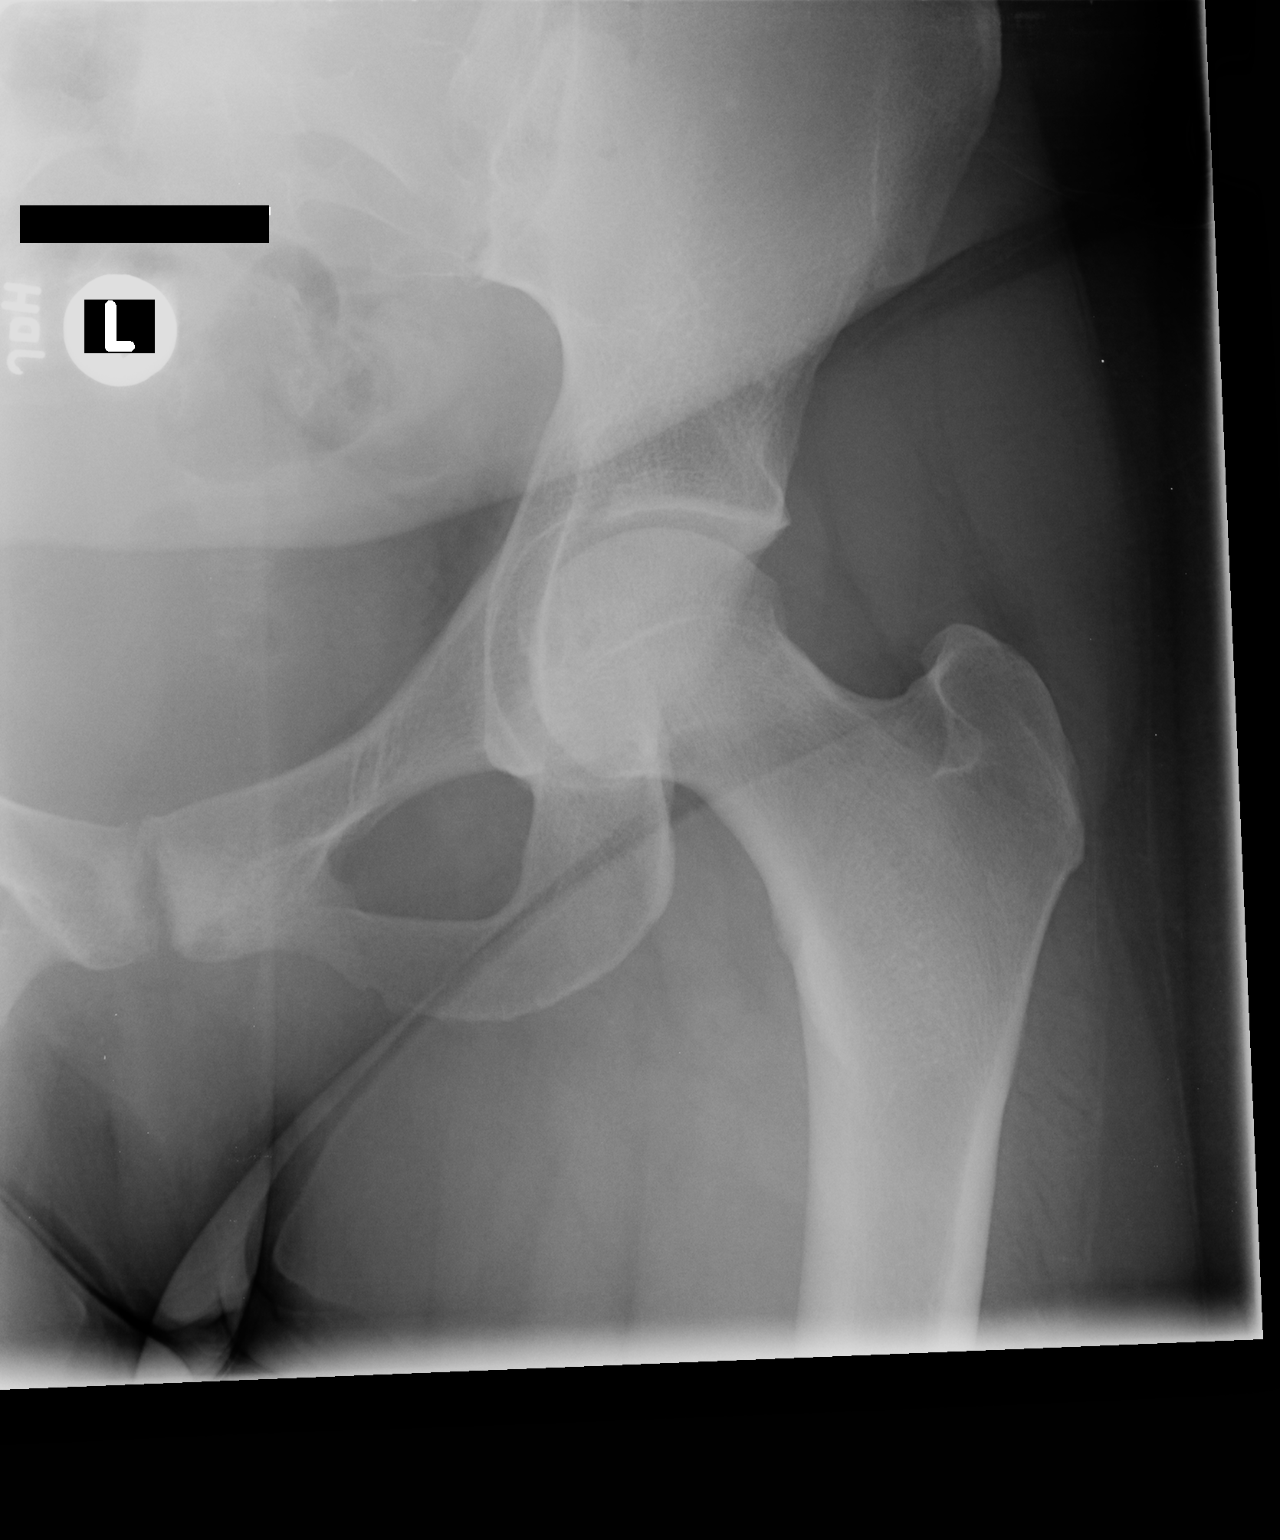

[view not recorded (2 of 2)]
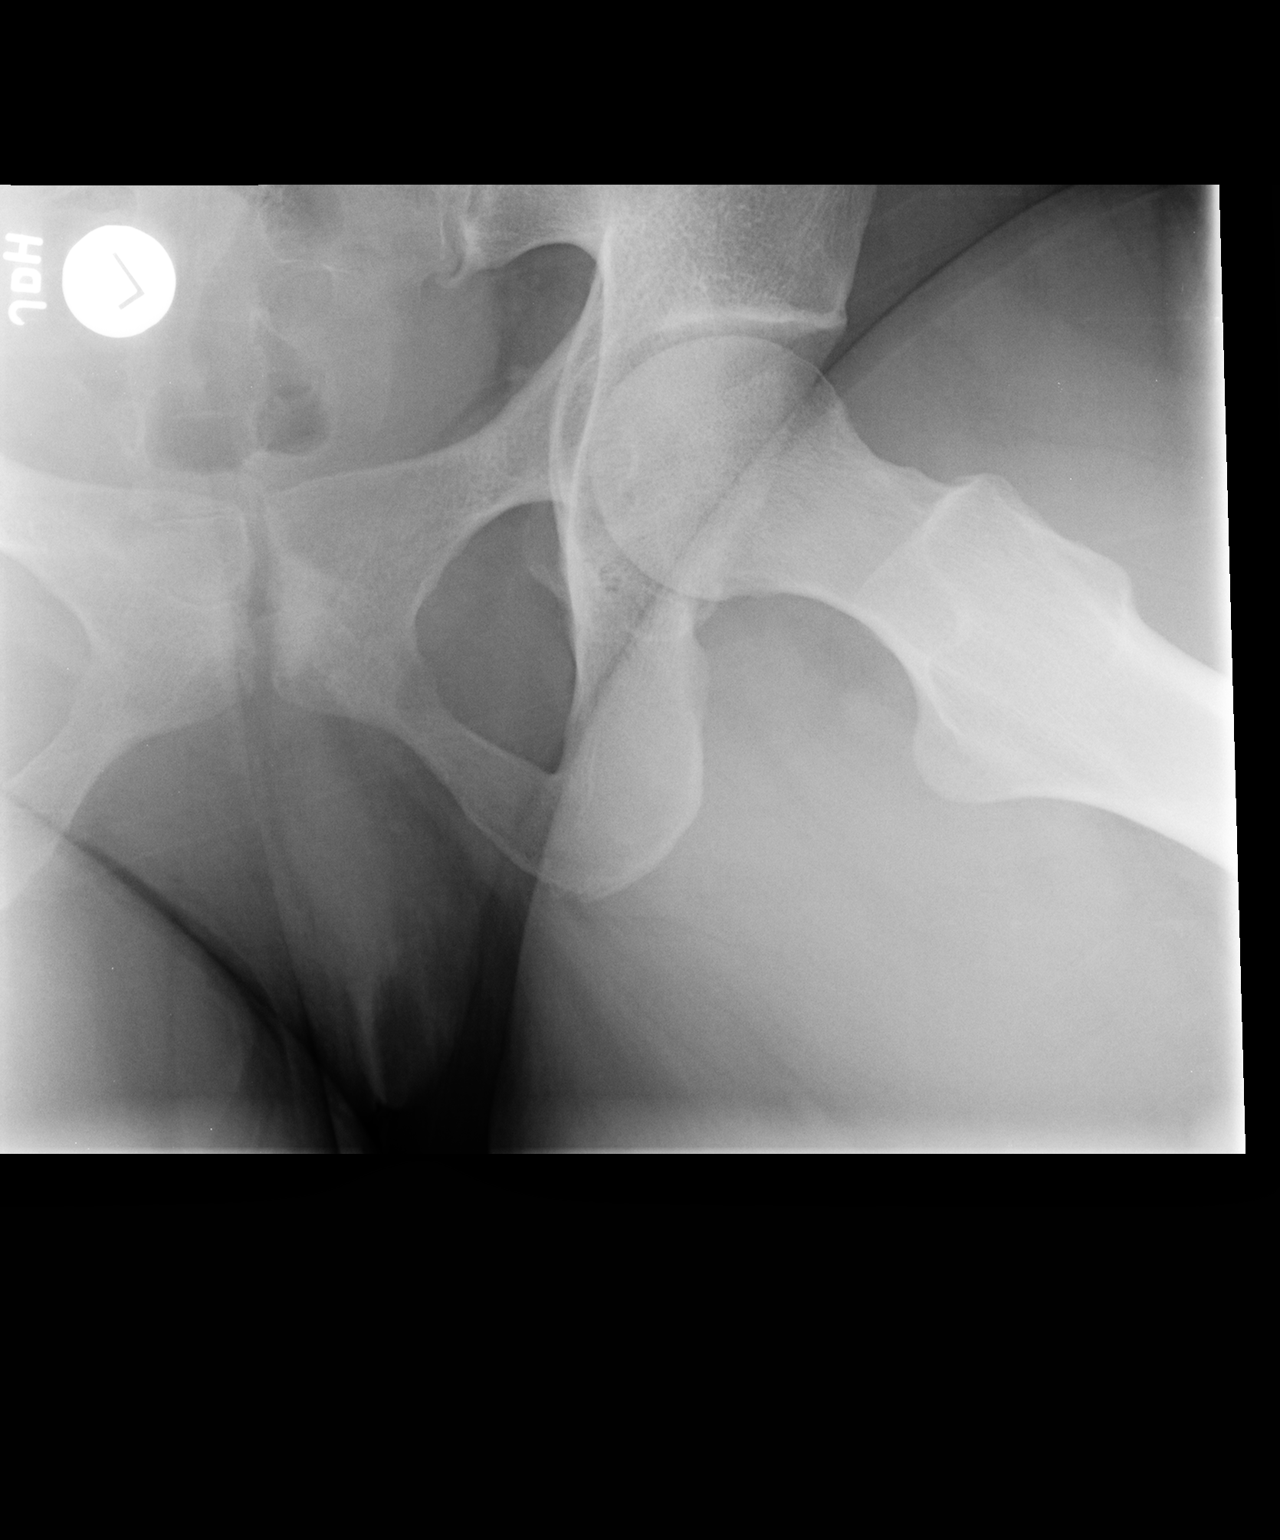

[2 of 2 positions shown; findings below may reference images not displayed]

FINDINGS: Bone mineralization is within normal limits. Left femoral head
normally located. Left hip joint space preserved. Proximal left
femur intact. Visible pelvis intact. Visible left SI joint and
sacrum appear normal.
IMPRESSION: Negative radiographic appearance of the left hip.

## 2020-11-22 ENCOUNTER — Other Ambulatory Visit: Payer: Self-pay

## 2020-11-22 ENCOUNTER — Encounter (HOSPITAL_COMMUNITY): Payer: Self-pay | Admitting: Obstetrics & Gynecology

## 2020-11-22 ENCOUNTER — Inpatient Hospital Stay (HOSPITAL_COMMUNITY)
Admission: AD | Admit: 2020-11-22 | Discharge: 2020-11-23 | DRG: 770 | Disposition: A | Discharge status: Home / Self Care | Payer: 59 | Attending: Obstetrics and Gynecology | Admitting: Obstetrics and Gynecology

## 2020-11-22 DIAGNOSIS — Z9109 Other allergy status, other than to drugs and biological substances: Secondary | ICD-10-CM

## 2020-11-22 DIAGNOSIS — Z881 Allergy status to other antibiotic agents status: Secondary | ICD-10-CM

## 2020-11-22 DIAGNOSIS — Z3A15 15 weeks gestation of pregnancy: Secondary | ICD-10-CM

## 2020-11-22 DIAGNOSIS — Z7982 Long term (current) use of aspirin: Secondary | ICD-10-CM

## 2020-11-22 DIAGNOSIS — Z887 Allergy status to serum and vaccine status: Secondary | ICD-10-CM

## 2020-11-22 DIAGNOSIS — Z885 Allergy status to narcotic agent status: Secondary | ICD-10-CM

## 2020-11-22 DIAGNOSIS — O09522 Supervision of elderly multigravida, second trimester: Secondary | ICD-10-CM

## 2020-11-22 DIAGNOSIS — Z20822 Contact with and (suspected) exposure to covid-19: Secondary | ICD-10-CM | POA: Diagnosis present

## 2020-11-22 DIAGNOSIS — O021 Missed abortion: Principal | ICD-10-CM | POA: Diagnosis present

## 2020-11-22 DIAGNOSIS — Z419 Encounter for procedure for purposes other than remedying health state, unspecified: Secondary | ICD-10-CM

## 2020-11-22 DIAGNOSIS — O132 Gestational [pregnancy-induced] hypertension without significant proteinuria, second trimester: Secondary | ICD-10-CM | POA: Diagnosis present

## 2020-11-22 LAB — TYPE AND SCREEN
ABO/RH(D): A POS
Antibody Screen: NEGATIVE

## 2020-11-22 LAB — CBC
HCT: 35.7 % — ABNORMAL LOW (ref 36.0–46.0)
Hemoglobin: 11.8 g/dL — ABNORMAL LOW (ref 12.0–15.0)
MCH: 28.3 pg (ref 26.0–34.0)
MCHC: 33.1 g/dL (ref 30.0–36.0)
MCV: 85.6 fL (ref 80.0–100.0)
Platelets: 329 10*3/uL (ref 150–400)
RBC: 4.17 MIL/uL (ref 3.87–5.11)
RDW: 12.9 % (ref 11.5–15.5)
WBC: 7.9 10*3/uL (ref 4.0–10.5)
nRBC: 0 % (ref 0.0–0.2)

## 2020-11-22 MED ORDER — FENTANYL CITRATE (PF) 100 MCG/2ML IJ SOLN
50.0000 ug | INTRAMUSCULAR | Status: DC | PRN
  Administered 2020-11-23 (×2): 100 ug via INTRAVENOUS
  Filled 2020-11-22 (×2): qty 2

## 2020-11-22 MED ORDER — OXYTOCIN-SODIUM CHLORIDE 30-0.9 UT/500ML-% IV SOLN
2.5000 [IU]/h | INTRAVENOUS | Status: DC
  Administered 2020-11-23: 2.5 [IU]/h via INTRAVENOUS
  Filled 2020-11-22: qty 500

## 2020-11-22 MED ORDER — LACTATED RINGERS IV SOLN: 500.0000 mL | INTRAVENOUS | Status: DC | PRN

## 2020-11-22 MED ORDER — OXYTOCIN BOLUS FROM INFUSION
333.0000 mL | Freq: Once | INTRAVENOUS | Status: AC
  Administered 2020-11-23: 333 mL via INTRAVENOUS

## 2020-11-22 MED ORDER — LIDOCAINE HCL (PF) 1 % IJ SOLN: 30.0000 mL | INTRAMUSCULAR | Status: DC | PRN

## 2020-11-22 MED ORDER — ONDANSETRON HCL 4 MG/2ML IJ SOLN: 4.0000 mg | Freq: Four times a day (QID) | INTRAMUSCULAR | Status: DC | PRN

## 2020-11-22 MED ORDER — MISOPROSTOL 200 MCG PO TABS
800.0000 ug | ORAL_TABLET | Freq: Once | ORAL | Status: AC
  Administered 2020-11-22: 800 ug via VAGINAL
  Filled 2020-11-22: qty 4

## 2020-11-22 MED ORDER — MISOPROSTOL 200 MCG PO TABS
400.0000 ug | ORAL_TABLET | ORAL | Status: DC
  Administered 2020-11-22 – 2020-11-23 (×3): 400 ug via VAGINAL
  Filled 2020-11-22 (×3): qty 2

## 2020-11-22 MED ORDER — LACTATED RINGERS IV SOLN: INTRAVENOUS | Status: DC

## 2020-11-22 MED ORDER — ZOLPIDEM TARTRATE 5 MG PO TABS: 5.0000 mg | ORAL_TABLET | Freq: Every evening | ORAL | Status: DC | PRN

## 2020-11-22 MED ORDER — SOD CITRATE-CITRIC ACID 500-334 MG/5ML PO SOLN: 30.0000 mL | ORAL | Status: DC | PRN

## 2020-11-22 MED ORDER — ACETAMINOPHEN 325 MG PO TABS
650.0000 mg | ORAL_TABLET | ORAL | Status: DC | PRN
  Administered 2020-11-22 – 2020-11-23 (×2): 650 mg via ORAL
  Filled 2020-11-22 (×2): qty 2

## 2020-11-22 NOTE — H&P (Signed)
Samantha Trujillo is a 40 y.o. female [phone] with MAB measuring [redacted]w[redacted]d presenting for IOL.  Antepartum course complicated by AMA with low risk NIPS.  Patient has h/o PPD; no medication during this pregnancy.  Patient has h/o GHTN and has taken ASA this pregnancy.  OB History     Gravida  2   Para  2   Term  2   Preterm  0   AB  0   Living  1      SAB  0   IAB  0   Ectopic  0   Multiple  0   Live Births  1          Past Medical History:  Diagnosis Date   Medical history non-contributory    Vaginal Pap smear, abnormal    Varicose veins    Past Surgical History:  Procedure Laterality Date   NO PAST SURGERIES     Family History: family history is not on file. Social History:  reports that she has never smoked. She does not have any smokeless tobacco history on file. She reports that she does not drink alcohol and does not use drugs.     Maternal Diabetes: No Genetic Screening: Normal Maternal Ultrasounds/Referrals: Normal Fetal Ultrasounds or other Referrals:  None Maternal Substance Abuse:  No Significant Maternal Medications:  None Significant Maternal Lab Results:  None Other Comments:  None  Review of Systems Maternal Medical History:  Prenatal complications: no prenatal complications Prenatal Complications - Diabetes: none.    unknown if currently breastfeeding. Maternal Exam:  Abdomen: Patient reports no abdominal tenderness. Fundal height is c/w dates.    Physical Exam Constitutional:      Appearance: Normal appearance.  HENT:     Head: Normocephalic and atraumatic.  Pulmonary:     Effort: Pulmonary effort is normal.  Abdominal:     Palpations: Abdomen is soft.  Musculoskeletal:        General: Normal range of motion.     Cervical back: Normal range of motion.  Skin:    General: Skin is warm and dry.  Neurological:     Mental Status: She is alert and oriented to person, place, and time.  Psychiatric:        Mood and Affect: Mood  normal.        Behavior: Behavior normal.    Prenatal labs: ABO, Rh:  A positive Antibody:  Negative Rubella:  Immune RPR:   NR HBsAg:   Negative HIV:   NR GBS:   unk  Assessment/Plan: 40yo [id number] with MAB at 15 weeks -Plan VMP 800 mcg and then 400 mcg q 3h -IV pain medication or CLEA if desired -Will consider fetal testing after delivery   Mitchel Honour 11/22/2020, 5:25 PM

## 2020-11-23 ENCOUNTER — Inpatient Hospital Stay (HOSPITAL_COMMUNITY): Payer: 59 | Admitting: Certified Registered"

## 2020-11-23 ENCOUNTER — Observation Stay (HOSPITAL_COMMUNITY): Payer: 59

## 2020-11-23 ENCOUNTER — Encounter (HOSPITAL_COMMUNITY): Payer: Self-pay | Admitting: Obstetrics & Gynecology

## 2020-11-23 ENCOUNTER — Encounter (HOSPITAL_COMMUNITY)
Admission: AD | Disposition: A | Discharge status: Home / Self Care | Payer: Self-pay | Source: Home / Self Care | Attending: Obstetrics & Gynecology

## 2020-11-23 DIAGNOSIS — Z885 Allergy status to narcotic agent status: Secondary | ICD-10-CM | POA: Diagnosis not present

## 2020-11-23 DIAGNOSIS — Z20822 Contact with and (suspected) exposure to covid-19: Secondary | ICD-10-CM | POA: Diagnosis present

## 2020-11-23 DIAGNOSIS — Z881 Allergy status to other antibiotic agents status: Secondary | ICD-10-CM | POA: Diagnosis not present

## 2020-11-23 DIAGNOSIS — Z9109 Other allergy status, other than to drugs and biological substances: Secondary | ICD-10-CM | POA: Diagnosis not present

## 2020-11-23 DIAGNOSIS — Z7982 Long term (current) use of aspirin: Secondary | ICD-10-CM | POA: Diagnosis not present

## 2020-11-23 DIAGNOSIS — Z3A15 15 weeks gestation of pregnancy: Secondary | ICD-10-CM | POA: Diagnosis not present

## 2020-11-23 DIAGNOSIS — O132 Gestational [pregnancy-induced] hypertension without significant proteinuria, second trimester: Secondary | ICD-10-CM | POA: Diagnosis present

## 2020-11-23 DIAGNOSIS — Z887 Allergy status to serum and vaccine status: Secondary | ICD-10-CM | POA: Diagnosis not present

## 2020-11-23 DIAGNOSIS — O09522 Supervision of elderly multigravida, second trimester: Secondary | ICD-10-CM | POA: Diagnosis not present

## 2020-11-23 DIAGNOSIS — O021 Missed abortion: Secondary | ICD-10-CM | POA: Diagnosis present

## 2020-11-23 HISTORY — PX: OPERATIVE ULTRASOUND: SHX5996

## 2020-11-23 HISTORY — PX: DILATION AND CURETTAGE OF UTERUS: SHX78

## 2020-11-23 LAB — CBC
HCT: 31 % — ABNORMAL LOW (ref 36.0–46.0)
Hemoglobin: 10.5 g/dL — ABNORMAL LOW (ref 12.0–15.0)
MCH: 29.1 pg (ref 26.0–34.0)
MCHC: 33.9 g/dL (ref 30.0–36.0)
MCV: 85.9 fL (ref 80.0–100.0)
Platelets: 283 10*3/uL (ref 150–400)
RBC: 3.61 MIL/uL — ABNORMAL LOW (ref 3.87–5.11)
RDW: 12.7 % (ref 11.5–15.5)
WBC: 8.9 10*3/uL (ref 4.0–10.5)
nRBC: 0 % (ref 0.0–0.2)

## 2020-11-23 LAB — RESP PANEL BY RT-PCR (FLU A&B, COVID) ARPGX2
Influenza A by PCR: NEGATIVE
Influenza B by PCR: NEGATIVE
SARS Coronavirus 2 by RT PCR: NEGATIVE

## 2020-11-23 LAB — RPR: RPR Ser Ql: NONREACTIVE

## 2020-11-23 SURGERY — DILATION AND CURETTAGE
Anesthesia: General | Site: Vagina

## 2020-11-23 MED ORDER — ONDANSETRON HCL 4 MG/2ML IJ SOLN
INTRAMUSCULAR | Status: AC
  Filled 2020-11-23: qty 2

## 2020-11-23 MED ORDER — OXYCODONE HCL 5 MG/5ML PO SOLN: 5.0000 mg | Freq: Once | ORAL | Status: DC | PRN

## 2020-11-23 MED ORDER — ACETAMINOPHEN 500 MG PO TABS
1000.0000 mg | ORAL_TABLET | Freq: Once | ORAL | Status: AC
  Administered 2020-11-23: 1000 mg via ORAL
  Filled 2020-11-23: qty 2

## 2020-11-23 MED ORDER — LIDOCAINE 2% (20 MG/ML) 5 ML SYRINGE
INTRAMUSCULAR | Status: DC | PRN
  Administered 2020-11-23: 100 mg via INTRAVENOUS

## 2020-11-23 MED ORDER — SCOPOLAMINE 1 MG/3DAYS TD PT72
1.0000 | MEDICATED_PATCH | Freq: Once | TRANSDERMAL | Status: DC
  Administered 2020-11-23: 1.5 mg via TRANSDERMAL
  Filled 2020-11-23: qty 1

## 2020-11-23 MED ORDER — OXYCODONE HCL 5 MG PO TABS: 5.0000 mg | ORAL_TABLET | Freq: Once | ORAL | Status: DC | PRN

## 2020-11-23 MED ORDER — DIPHENHYDRAMINE HCL 50 MG/ML IJ SOLN
INTRAMUSCULAR | Status: DC | PRN
  Administered 2020-11-23: 12.5 mg via INTRAVENOUS

## 2020-11-23 MED ORDER — CHLORHEXIDINE GLUCONATE 0.12 % MT SOLN
OROMUCOSAL | Status: AC
  Administered 2020-11-23: 15 mL via OROMUCOSAL
  Filled 2020-11-23: qty 15

## 2020-11-23 MED ORDER — CEFAZOLIN SODIUM-DEXTROSE 2-4 GM/100ML-% IV SOLN
2.0000 g | INTRAVENOUS | Status: AC
  Administered 2020-11-23: 2 g via INTRAVENOUS

## 2020-11-23 MED ORDER — FENTANYL CITRATE (PF) 100 MCG/2ML IJ SOLN
INTRAMUSCULAR | Status: DC | PRN
  Administered 2020-11-23 (×2): 50 ug via INTRAVENOUS

## 2020-11-23 MED ORDER — 0.9 % SODIUM CHLORIDE (POUR BTL) OPTIME
TOPICAL | Status: DC | PRN
  Administered 2020-11-23: 1000 mL

## 2020-11-23 MED ORDER — MIDAZOLAM HCL 2 MG/2ML IJ SOLN
INTRAMUSCULAR | Status: DC | PRN
  Administered 2020-11-23: 2 mg via INTRAVENOUS

## 2020-11-23 MED ORDER — OXYTOCIN 10 UNIT/ML IJ SOLN
INTRAMUSCULAR | Status: AC
  Filled 2020-11-23: qty 1

## 2020-11-23 MED ORDER — PROPOFOL 10 MG/ML IV BOLUS
INTRAVENOUS | Status: DC | PRN
  Administered 2020-11-23: 200 mg via INTRAVENOUS

## 2020-11-23 MED ORDER — DEXAMETHASONE SODIUM PHOSPHATE 10 MG/ML IJ SOLN
INTRAMUSCULAR | Status: DC | PRN
  Administered 2020-11-23: 10 mg via INTRAVENOUS

## 2020-11-23 MED ORDER — OXYTOCIN 10 UNIT/ML IJ SOLN
INTRAMUSCULAR | Status: DC | PRN
  Administered 2020-11-23: 10 [IU] via INTRAMUSCULAR

## 2020-11-23 MED ORDER — PROPOFOL 10 MG/ML IV BOLUS
INTRAVENOUS | Status: AC
  Filled 2020-11-23: qty 20

## 2020-11-23 MED ORDER — LIDOCAINE HCL 1 % IJ SOLN
INTRAMUSCULAR | Status: AC
  Filled 2020-11-23: qty 20

## 2020-11-23 MED ORDER — SOD CITRATE-CITRIC ACID 500-334 MG/5ML PO SOLN
ORAL | Status: AC
  Administered 2020-11-23: 30 mL via ORAL
  Filled 2020-11-23: qty 30

## 2020-11-23 MED ORDER — FENTANYL CITRATE (PF) 100 MCG/2ML IJ SOLN: 25.0000 ug | INTRAMUSCULAR | Status: DC | PRN

## 2020-11-23 MED ORDER — LIDOCAINE 2% (20 MG/ML) 5 ML SYRINGE
INTRAMUSCULAR | Status: AC
  Filled 2020-11-23: qty 5

## 2020-11-23 MED ORDER — LACTATED RINGERS IV SOLN: INTRAVENOUS | Status: DC

## 2020-11-23 MED ORDER — ONDANSETRON HCL 4 MG/2ML IJ SOLN
INTRAMUSCULAR | Status: DC | PRN
  Administered 2020-11-23: 4 mg via INTRAVENOUS

## 2020-11-23 MED ORDER — PROMETHAZINE HCL 25 MG/ML IJ SOLN: 6.2500 mg | INTRAMUSCULAR | Status: DC | PRN

## 2020-11-23 MED ORDER — SOD CITRATE-CITRIC ACID 500-334 MG/5ML PO SOLN: 30.0000 mL | ORAL | Status: AC

## 2020-11-23 MED ORDER — MIDAZOLAM HCL 2 MG/2ML IJ SOLN
INTRAMUSCULAR | Status: AC
  Filled 2020-11-23: qty 2

## 2020-11-23 MED ORDER — ORAL CARE MOUTH RINSE: 15.0000 mL | Freq: Once | OROMUCOSAL | Status: AC

## 2020-11-23 MED ORDER — SUCCINYLCHOLINE CHLORIDE 200 MG/10ML IV SOSY
PREFILLED_SYRINGE | INTRAVENOUS | Status: AC
  Filled 2020-11-23: qty 10

## 2020-11-23 MED ORDER — ACETAMINOPHEN 325 MG PO TABS: 650.0000 mg | ORAL_TABLET | Freq: Four times a day (QID) | ORAL | 0 refills | Status: DC | PRN

## 2020-11-23 MED ORDER — CEFAZOLIN SODIUM-DEXTROSE 2-4 GM/100ML-% IV SOLN
INTRAVENOUS | Status: AC
  Filled 2020-11-23: qty 100

## 2020-11-23 MED ORDER — POVIDONE-IODINE 10 % EX SWAB
2.0000 "application " | Freq: Once | CUTANEOUS | Status: AC
  Administered 2020-11-23: 2 via TOPICAL

## 2020-11-23 MED ORDER — FENTANYL CITRATE (PF) 250 MCG/5ML IJ SOLN
INTRAMUSCULAR | Status: AC
  Filled 2020-11-23: qty 5

## 2020-11-23 MED ORDER — CHLORHEXIDINE GLUCONATE 0.12 % MT SOLN: 15.0000 mL | Freq: Once | OROMUCOSAL | Status: AC

## 2020-11-23 MED ORDER — DIPHENHYDRAMINE HCL 50 MG/ML IJ SOLN
INTRAMUSCULAR | Status: AC
  Filled 2020-11-23: qty 1

## 2020-11-23 MED ORDER — METHYLERGONOVINE MALEATE 0.2 MG/ML IJ SOLN
INTRAMUSCULAR | Status: AC
  Filled 2020-11-23: qty 1

## 2020-11-23 MED ORDER — DEXAMETHASONE SODIUM PHOSPHATE 10 MG/ML IJ SOLN
INTRAMUSCULAR | Status: AC
  Filled 2020-11-23: qty 1

## 2020-11-23 MED ORDER — CEFAZOLIN SODIUM-DEXTROSE 2-4 GM/100ML-% IV SOLN
2.0000 g | Freq: Three times a day (TID) | INTRAVENOUS | Status: AC
  Administered 2020-11-23: 2 g via INTRAVENOUS
  Filled 2020-11-23: qty 100

## 2020-11-23 SURGICAL SUPPLY — 14 items
CATH ROBINSON RED A/P 16FR (CATHETERS) ×2 IMPLANT
CNTNR URN SCR LID CUP LEK RST (MISCELLANEOUS) ×1 IMPLANT
CONT SPEC 4OZ STRL OR WHT (MISCELLANEOUS) ×1
GLOVE SRG 8 PF TXTR STRL LF DI (GLOVE) ×1 IMPLANT
GLOVE SURG ENC MOIS LTX SZ8 (GLOVE) ×4 IMPLANT
GLOVE SURG UNDER POLY LF SZ7 (GLOVE) ×2 IMPLANT
GLOVE SURG UNDER POLY LF SZ8 (GLOVE) ×1
GOWN STRL REUS W/ TWL LRG LVL3 (GOWN DISPOSABLE) ×2 IMPLANT
GOWN STRL REUS W/TWL LRG LVL3 (GOWN DISPOSABLE) ×2
PACK VAGINAL MINOR WOMEN LF (CUSTOM PROCEDURE TRAY) ×2 IMPLANT
PAD OB MATERNITY 4.3X12.25 (PERSONAL CARE ITEMS) ×2 IMPLANT
TOWEL GREEN STERILE FF (TOWEL DISPOSABLE) ×4 IMPLANT
UNDERPAD 30X36 HEAVY ABSORB (UNDERPADS AND DIAPERS) ×2 IMPLANT
VACURETTE 7MM CVD STRL WRAP (CANNULA) ×2 IMPLANT

## 2020-11-23 NOTE — Progress Notes (Signed)
Patient received third dose of cytotec around 0115 (800, 400, ) and around 0145 felt a gush of fluid.  I spoke with RN and came to evaluate patient.  Quarter sized clot at introitus and palpable fetal parts on vaginal exam.  Patient pushed about 20 minutes in the bed and then moved to the toilet.  She delivered the fetus into top hat at 0231 while on the toilet; placenta still in.  Cord was clamped and cut and patient was shown the fetus.  Overall, normal appearing fetus with exception of compression of cranium.  Tissue is tan colored and not sloughing.  No obvious anomaly.  Bleeding is scant.  High dose pitocin was started and patient was helped back into bed to await placental delivery.  Will closely monitor vaginal bleeding and recheck soon.    Mitchel Honour, DO

## 2020-11-23 NOTE — Plan of Care (Signed)

## 2020-11-23 NOTE — Progress Notes (Signed)
Tired but otherwise feels OK Passed a mass into toilet this am Bleeding minimal  Today's Vitals   11/23/20 0411 11/23/20 0500 11/23/20 0700 11/23/20 0715  BP: 117/73  (!) 111/48 (!) 105/47  Pulse: 87  69 69  Resp: 18     Temp: 99 F (37.2 C)     TempSrc: Oral     SpO2: 100%     Weight:  90.7 kg    Height:  5\' 6"  (1.676 m)    PainSc:   0-No pain    Body mass index is 32.27 kg/m.   Abdomen NT  U/S suspicious for retained POC.  Careful examination of mass in toilet>clot, no obvious tissue  A/P: D/W possible retained POC and risk of infection or hemorrhage Recommend D&C. D/W procedure and risks including infection, uterine perforation and organ damage, bleeding/transfusion-HIV/Hep, DVT/PE, pneumonia, IU synechia and secondary infertility. She states she understands and agrees. Consent signed.

## 2020-11-23 NOTE — Anesthesia Procedure Notes (Signed)
Procedure Name: LMA Insertion Date/Time: 11/23/2020 3:32 PM Performed by: Pearson Grippe, CRNA Pre-anesthesia Checklist: Patient identified, Emergency Drugs available, Suction available and Patient being monitored Patient Re-evaluated:Patient Re-evaluated prior to induction Oxygen Delivery Method: Circle system utilized Preoxygenation: Pre-oxygenation with 100% oxygen Induction Type: IV induction Ventilation: Mask ventilation without difficulty LMA: LMA inserted LMA Size: 4.0 Number of attempts: 1 Airway Equipment and Method: Bite block Placement Confirmation: positive ETCO2 Tube secured with: Tape Dental Injury: Teeth and Oropharynx as per pre-operative assessment

## 2020-11-23 NOTE — Anesthesia Preprocedure Evaluation (Addendum)
Anesthesia Evaluation  Patient identified by MRN, date of birth, ID band Patient awake    Reviewed: Allergy & Precautions, NPO status , Patient's Chart, lab work & pertinent test results  History of Anesthesia Complications Negative for: history of anesthetic complications  Airway Mallampati: II  TM Distance: >3 FB Neck ROM: Full    Dental no notable dental hx.    Pulmonary neg pulmonary ROS,    Pulmonary exam normal        Cardiovascular negative cardio ROS Normal cardiovascular exam     Neuro/Psych negative neurological ROS  negative psych ROS   GI/Hepatic negative GI ROS, Neg liver ROS,   Endo/Other  negative endocrine ROS  Renal/GU negative Renal ROS  negative genitourinary   Musculoskeletal negative musculoskeletal ROS (+)   Abdominal   Peds  Hematology  (+) anemia , Hgb 10.5   Anesthesia Other Findings Day of surgery medications reviewed with patient.  Reproductive/Obstetrics Missed Ab [redacted]w[redacted]d                            Anesthesia Physical Anesthesia Plan  ASA: 2  Anesthesia Plan: General   Post-op Pain Management:    Induction: Intravenous  PONV Risk Score and Plan: 3 and Treatment may vary due to age or medical condition, Ondansetron, Dexamethasone, Midazolam and Scopolamine patch - Pre-op  Airway Management Planned: LMA  Additional Equipment: None  Intra-op Plan:   Post-operative Plan: Extubation in OR  Informed Consent: I have reviewed the patients History and Physical, chart, labs and discussed the procedure including the risks, benefits and alternatives for the proposed anesthesia with the patient or authorized representative who has indicated his/her understanding and acceptance.     Dental advisory given  Plan Discussed with: CRNA  Anesthesia Plan Comments:        Anesthesia Quick Evaluation

## 2020-11-23 NOTE — Transfer of Care (Signed)
Immediate Anesthesia Transfer of Care Note  Patient: Samantha Trujillo  Procedure(s) Performed: DILATATION AND CURETTAGE (Vagina ) OPERATIVE ULTRASOUND (Vagina )  Patient Location: PACU  Anesthesia Type:General  Level of Consciousness: awake, alert  and oriented  Airway & Oxygen Therapy: Patient Spontanous Breathing and Patient connected to face mask oxygen  Post-op Assessment: Report given to RN and Post -op Vital signs reviewed and stable  Post vital signs: Reviewed and stable  Last Vitals:  Vitals Value Taken Time  BP 125/74   Temp    Pulse 76 11/23/20 1613  Resp 16 11/23/20 1613  SpO2 96 % 11/23/20 1613  Vitals shown include unvalidated device data.  Last Pain:  Vitals:   11/23/20 1501  TempSrc: Oral  PainSc: 0-No pain         Complications: No notable events documented.

## 2020-11-23 NOTE — Progress Notes (Signed)
In to see patient.  C/O diarrhea.  Offered Lomotil but declines.   Small blood on pad.  Placenta is palpable at cervical os. Will redose VMP 400 mcg at 415 if placenta not delivered by then. Will allow 4 hours for placental delivery unless vaginal bleeding increases in which case I will proceed with D&C.  Mitchel Honour, DO

## 2020-11-23 NOTE — Progress Notes (Signed)
11/23/2020  4:05 PM  PATIENT:  Samantha Trujillo  40 y.o. female  PRE-OPERATIVE DIAGNOSIS:  Retained Products of Conception  POST-OPERATIVE DIAGNOSIS:  same  PROCEDURE:  Procedure(s): DILATATION AND CURETTAGE (N/A) OPERATIVE ULTRASOUND (N/A)  SURGEON:  Surgeon(s) and Role:    * Harold Hedge, MD - Primary  PHYSICIAN ASSISTANT:   ASSISTANTS: none   ANESTHESIA:   general  EBL:  100 mL   BLOOD ADMINISTERED:none  DRAINS: none   LOCAL MEDICATIONS USED:  NONE  SPECIMEN:  Source of Specimen:  products of conception  DISPOSITION OF SPECIMEN:  PATHOLOGY  COUNTS:  YES  TOURNIQUET:  * No tourniquets in log *  DICTATION: .Other Dictation: Dictation Number [id number]  PLAN OF CARE: Discharge to home after PACU  PATIENT DISPOSITION:  PACU - hemodynamically stable.   Delay start of Pharmacological VTE agent (>24hrs) due to surgical blood loss or risk of bleeding: not applicable

## 2020-11-23 NOTE — Progress Notes (Addendum)
Placenta had not delivered by 4 hours after fetus.  I examined patient and felt placenta at external os.  Patient is offered D&C vs attempted removal with forceps.  Patient strongly desires to avoid D&C.  Bleeding remains light.  Patient is given another dose of fentanyl 100 mcg.  Patient is positioned on bedpan and rolling light brought into the room for better visualization.  Placenta is visualized and using ring forceps, slow traction and twisting removed the placenta in 3 pieces; completion of delivery at 0557.  Placenta sent to pathology.  Bleeding is minimal.  Total EBL during entire induction is 600 cc.  Patient tolerated well.  Plan to check an u/s to confirm no retained placenta.  Will give Ancef 2 g for prophylaxis.  Patient counseled re: fetal testing unlikely resulting in adequate specimen growth given demise likely about 3 weeks ago but she is offered it.  She is also counseled re: hospital disposal vs funeral home for cremation or private burial.  Patient will consider.    Mitchel Honour, DO

## 2020-11-23 NOTE — Progress Notes (Signed)
Bleeding like a period. Just up to BR and noticed some mild dizziness No passage of tissue  Today's Vitals   11/23/20 1013 11/23/20 1054 11/23/20 1130 11/23/20 1313  BP:  102/61  (!) 133/55  Pulse:  69  83  Resp:  18  18  Temp:  98.7 F (37.1 C)  98.5 F (36.9 C)  TempSrc:  Oral  Oral  SpO2:  100%  100%  Weight:      Height:      PainSc: 0-No pain  Asleep    Body mass index is 32.27 kg/m.   Results for orders placed or performed during the hospital encounter of 11/22/20 (from the past 24 hour(s))  CBC     Status: Abnormal   Collection Time: 11/22/20  6:41 PM  Result Value Ref Range   WBC 7.9 4.0 - 10.5 K/uL   RBC 4.17 3.87 - 5.11 MIL/uL   Hemoglobin 11.8 (L) 12.0 - 15.0 g/dL   HCT 96.7 (L) 59.1 - 63.8 %   MCV 85.6 80.0 - 100.0 fL   MCH 28.3 26.0 - 34.0 pg   MCHC 33.1 30.0 - 36.0 g/dL   RDW 46.6 59.9 - 35.7 %   Platelets 329 150 - 400 K/uL   nRBC 0.0 0.0 - 0.2 %  Type and screen [facility]     Status: None   Collection Time: 11/22/20  6:41 PM  Result Value Ref Range   ABO/RH(D) A POS    Antibody Screen NEG    Sample Expiration      11/25/2020,2359 Performed at [facility], [address], [place], [place] [address]   RPR     Status: None   Collection Time: 11/22/20  6:41 PM  Result Value Ref Range   RPR Ser Ql NON REACTIVE NON REACTIVE  Resp Panel by RT-PCR (Flu A&B, Covid) Nasopharyngeal Swab     Status: None   Collection Time: 11/23/20  8:41 AM   Specimen: Nasopharyngeal Swab; Nasopharyngeal(NP) swabs in vial transport medium  Result Value Ref Range   SARS Coronavirus 2 by RT PCR NEGATIVE NEGATIVE   Influenza A by PCR NEGATIVE NEGATIVE   Influenza B by PCR NEGATIVE NEGATIVE  CBC     Status: Abnormal   Collection Time: 11/23/20  9:03 AM  Result Value Ref Range   WBC 8.9 4.0 - 10.5 K/uL   RBC 3.61 (L) 3.87 - 5.11 MIL/uL   Hemoglobin 10.5 (L) 12.0 - 15.0 g/dL   HCT 39.0 (L) 30.0 - 92.3 %   MCV 85.9 80.0 - 100.0 fL   MCH 29.1 26.0 -  34.0 pg   MCHC 33.9 30.0 - 36.0 g/dL   RDW 30.0 76.2 - 26.3 %   Platelets 283 150 - 400 K/uL   nRBC 0.0 0.0 - 0.2 %     A/P: MAB now with probable retained POC         Reviewed plans for [facility], all questions answered.         She states she understands and agrees.

## 2020-11-24 ENCOUNTER — Encounter (HOSPITAL_COMMUNITY): Payer: Self-pay | Admitting: Obstetrics and Gynecology

## 2020-11-24 NOTE — Discharge Summary (Addendum)
Physician Discharge Summary  Patient ID: Samantha Trujillo MRN: [id number] DOB/AGE: [birth year] 40 y.o.  Admit date: 11/22/2020 Discharge date: 11/23/20  Admission Diagnoses: Missed abortion   Discharge Diagnoses:  Active Problems:   Missed abortion   Discharged Condition: good  Hospital Course: Admitted for misoprostol induction. She received 800 mcg per vagina initially then 400 mcg q 3 hours. After 3 doses she delivered in the toilet. The placenta did not deliver.  IV pitocin is started. An hour later the placenta is palpable at the cervical os. She is given 40 mcg misoprostol PV. 2-3 hours later patient is offered D&C but strongly desires to not have that done. The placenta is removed with ring forceps in 3 portions. She is given Ancef 2 gm IV and a subsequent U/S notes probable retained products of conception in EM cavity. She continues to bleed like a light menstrual flow. D&C is recommended and she accepts at that time. She is taken to the OR, a repeat dose of Ancef is given, and undergoes D&C with intraoperative U/S guidance. She recovers well and is discharged from the PACU.  Consults: None  Significant Diagnostic Studies: labs:  Results for orders placed or performed during the hospital encounter of 11/22/20 (from the past 72 hour(s))  CBC     Status: Abnormal   Collection Time: 11/22/20  6:41 PM  Result Value Ref Range   WBC 7.9 4.0 - 10.5 K/uL   RBC 4.17 3.87 - 5.11 MIL/uL   Hemoglobin 11.8 (L) 12.0 - 15.0 g/dL   HCT 42.5 (L) 95.6 - 38.7 %   MCV 85.6 80.0 - 100.0 fL   MCH 28.3 26.0 - 34.0 pg   MCHC 33.1 30.0 - 36.0 g/dL   RDW 56.4 33.2 - 95.1 %   Platelets 329 150 - 400 K/uL   nRBC 0.0 0.0 - 0.2 %    Comment: Performed at [facility], [address], [place], [place] [address]  Type and screen [facility]     Status: None   Collection Time: 11/22/20  6:41 PM  Result Value Ref Range   ABO/RH(D) A POS    Antibody Screen NEG    Sample  Expiration      11/25/2020,2359 Performed at [facility], [address], [place], [place] [address]   RPR     Status: None   Collection Time: 11/22/20  6:41 PM  Result Value Ref Range   RPR Ser Ql NON REACTIVE NON REACTIVE    Comment: Performed at [facility], [address] [place], [place] [address]  Resp Panel by RT-PCR (Flu A&B, Covid) Nasopharyngeal Swab     Status: None   Collection Time: 11/23/20  8:41 AM   Specimen: Nasopharyngeal Swab; Nasopharyngeal(NP) swabs in vial transport medium  Result Value Ref Range   SARS Coronavirus 2 by RT PCR NEGATIVE NEGATIVE    Comment: (NOTE) SARS-CoV-2 target nucleic acids are NOT DETECTED.  The SARS-CoV-2 RNA is generally detectable in upper respiratory specimens during the acute phase of infection. The lowest concentration of SARS-CoV-2 viral copies this assay can detect is 138 copies/mL. A negative result does not preclude SARS-Cov-2 infection and should not be used as the sole basis for treatment or other patient management decisions. A negative result may occur with  improper specimen collection/handling, submission of specimen other than nasopharyngeal swab, presence of viral mutation(s) within the areas targeted by this assay, and inadequate number of viral copies(<138 copies/mL). A  negative result must be combined with clinical observations, patient history, and epidemiological information. The expected result is Negative.  Fact Sheet for Patients:  BloggerCourse.com  Fact Sheet for Healthcare Providers:  SeriousBroker.it  This test is no t yet approved or cleared by the [place] FDA and  has been authorized for detection and/or diagnosis of SARS-CoV-2 by FDA under an Emergency Use Authorization (EUA). This EUA will remain  in effect (meaning this test can be used) for the duration of the COVID-19 declaration under Section 564(b)(1) of the Act,  21 U.S.C.section 360bbb-3(b)(1), unless the authorization is terminated  or revoked sooner.       Influenza A by PCR NEGATIVE NEGATIVE   Influenza B by PCR NEGATIVE NEGATIVE    Comment: (NOTE) The Xpert Xpress SARS-CoV-2/FLU/RSV plus assay is intended as an aid in the diagnosis of influenza from Nasopharyngeal swab specimens and should not be used as a sole basis for treatment. Nasal washings and aspirates are unacceptable for Xpert Xpress SARS-CoV-2/FLU/RSV testing.  Fact Sheet for Patients: BloggerCourse.com  Fact Sheet for Healthcare Providers: SeriousBroker.it  This test is not yet approved or cleared by the [place] FDA and has been authorized for detection and/or diagnosis of SARS-CoV-2 by FDA under an Emergency Use Authorization (EUA). This EUA will remain in effect (meaning this test can be used) for the duration of the COVID-19 declaration under Section 564(b)(1) of the Act, 21 U.S.C. section 360bbb-3(b)(1), unless the authorization is terminated or revoked.  Performed at [facility], [address], [place], [place] [address]   CBC     Status: Abnormal   Collection Time: 11/23/20  9:03 AM  Result Value Ref Range   WBC 8.9 4.0 - 10.5 K/uL   RBC 3.61 (L) 3.87 - 5.11 MIL/uL   Hemoglobin 10.5 (L) 12.0 - 15.0 g/dL   HCT 57.8 (L) 46.9 - 62.9 %   MCV 85.9 80.0 - 100.0 fL   MCH 29.1 26.0 - 34.0 pg   MCHC 33.9 30.0 - 36.0 g/dL   RDW 52.8 41.3 - 24.4 %   Platelets 283 150 - 400 K/uL   nRBC 0.0 0.0 - 0.2 %    Comment: Performed at [facility], [address], [place], [place] [address]     Treatments: IV hydration, antibiotics: Ancef, and surgery: D&C  Discharge Exam: Blood pressure 127/84, pulse 81, temperature 98.9 F (37.2 C), resp. rate 13, height 5\' 6"  (1.676 m), weight 90.7 kg, SpO2 99 %, unknown if currently breastfeeding. General appearance: alert, cooperative, and no distress GI: soft,  non-tender; bowel sounds normal; no masses,  no organomegaly  Disposition: Discharge disposition: 01-Home or Self Care        Allergies as of 11/23/2020       Reactions   Augmentin [amoxicillin-pot Clavulanate] Other (See Comments)   dizzy   Covid-19 Mrna Vacc (moderna)    Patient is allergic to ALL COVID 19 vaccines   Codeine Rash   Zithromax [azithromycin] Rash        Medication List     STOP taking these medications    benzocaine-Menthol 20-0.5 % Aero Commonly known as: DERMOPLAST   pseudoephedrine-acetaminophen 30-500 MG Tabs tablet Commonly known as: TYLENOL SINUS       TAKE these medications    acetaminophen 325 MG tablet Commonly known as: TYLENOL Take 2 tablets (650 mg total) by mouth every 6 (six) hours as needed (for pain scale < 4  OR  temperature  >/=  100.5 F).   ibuprofen 600 MG tablet Commonly known as: ADVIL Take 1 tablet (600 mg total) by mouth every 6 (six) hours as needed.   prenatal multivitamin Tabs tablet Take 1 tablet by mouth daily at 12 noon.         Signed: Roselle Locus II 11/24/2020, 10:33 AM

## 2020-11-24 NOTE — Op Note (Signed)
NAMEJEVAEH, SHAMS MEDICAL RECORD NO: [id number] ACCOUNT NO: [id number] DATE OF BIRTH: [birth year] FACILITY: [facility] LOCATION: [facility] PHYSICIAN: Guy Sandifer. Arleta Creek, MD  Operative Report   DATE OF PROCEDURE: 11/23/2020   PREOPERATIVE DIAGNOSIS:  Retained products of conception.  POSTOPERATIVE DIAGNOSIS:  Retained products of conception.  PROCEDURE:  Dilation and curettage with intraoperative ultrasound.  SURGEON:  Guy Sandifer. Arleta Creek, MD  ANESTHESIA:  General with LMA.  ESTIMATED BLOOD LOSS:  100 mL.   SPECIMENS:  Products of conception to pathology.  INDICATIONS AND CONSENT:  This patient is a 40 year old G2 P1 who had a missed abortion at 61 and 5/7 weeks.  She was admitted and underwent misoprostol induction and delivered early this a.m.  After about 4 hours, the placenta was at the cervix and was  delivered with forceps.  Subsequent ultrasound was suspicious for retained products of conception.  The patient continued to have bleeding.  Dilation and curettage was discussed.  Potential risks and complications were reviewed preoperatively including  but not limited to infection, uterine perforation, organ damage, bleeding requiring transfusion of blood products with HIV and hepatitis acquisition, DVT, PE, pneumonia and intrauterine synechiae and secondary infertility.  She states she understands and  agrees and consent is signed and on the chart.  DESCRIPTION OF PROCEDURE:  The patient was taken to the operating room where she was identified, placed in the dorsal supine position and anesthesia was induced via LMA.  She was placed in the dorsal lithotomy position.  Timeout was undertaken.  She is  prepped with Betadine and draped in a sterile fashion.  Examination reveals the uterus to be about 8-10 weeks in size.  Intraoperative ultrasound was used and probable retained products of conception are identified.  The bivalve speculum was placed and  the anterior cervical lip was  grasped with a single tooth tenaculum.  Cervix was already dilated.  A #7 curved curette was then passed with direct observation via the ultrasound.  Alternating suction and sharp curettage was done.  Some bits of tissue  with obvious retained products of conception are noted.  10 units of Pitocin were added to the remaining 500 mL of IV fluid after the initiation of the case.  At the end, the cavity was gritty to curettage and appears clean on ultrasound.  Good  hemostasis was noted.  Instruments are removed.  All counts were correct.  The patient was awakened and taken to recovery room in stable condition.     SUJ D: 11/23/2020 4:10:22 pm T: 11/24/2020 5:35:00 am  JOB: [id number]/ [id number]

## 2020-11-24 NOTE — Anesthesia Postprocedure Evaluation (Signed)
Anesthesia Post Note  Patient: Samantha Trujillo  Procedure(s) Performed: DILATATION AND CURETTAGE (Vagina ) OPERATIVE ULTRASOUND (Vagina )     Patient location during evaluation: PACU Anesthesia Type: General Level of consciousness: awake and alert and oriented Pain management: pain level controlled Vital Signs Assessment: post-procedure vital signs reviewed and stable Respiratory status: spontaneous breathing, nonlabored ventilation and respiratory function stable Cardiovascular status: blood pressure returned to baseline Postop Assessment: no apparent nausea or vomiting Anesthetic complications: no   No notable events documented.  Last Vitals:  Vitals:   11/23/20 1642 11/23/20 1657  BP: 127/77 127/84  Pulse: 87 81  Resp: 15 13  Temp:  37.2 C  SpO2: 97% 99%    Last Pain:  Vitals:   11/23/20 1642  TempSrc:   PainSc: 0-No pain                 Shanda Howells

## 2020-11-25 LAB — SURGICAL PATHOLOGY

## 2021-06-12 DIAGNOSIS — N96 Recurrent pregnancy loss: Secondary | ICD-10-CM | POA: Insufficient documentation

## 2021-06-13 LAB — HM PAP SMEAR: HPV, high-risk: NEGATIVE

## 2021-08-03 LAB — HEPATITIS C ANTIBODY: HCV Ab: NEGATIVE

## 2021-08-03 LAB — OB RESULTS CONSOLE ABO/RH: RH Type: POSITIVE

## 2021-08-03 LAB — OB RESULTS CONSOLE HEPATITIS B SURFACE ANTIGEN: Hepatitis B Surface Ag: NEGATIVE

## 2021-08-03 LAB — OB RESULTS CONSOLE ANTIBODY SCREEN: Antibody Screen: NEGATIVE

## 2021-08-03 LAB — OB RESULTS CONSOLE RUBELLA ANTIBODY, IGM: Rubella: IMMUNE

## 2021-08-03 LAB — OB RESULTS CONSOLE HIV ANTIBODY (ROUTINE TESTING): HIV: NONREACTIVE

## 2021-08-03 LAB — OB RESULTS CONSOLE RPR: RPR: NONREACTIVE

## 2021-08-11 LAB — OB RESULTS CONSOLE GC/CHLAMYDIA
Chlamydia: NEGATIVE
Neisseria Gonorrhea: NEGATIVE

## 2021-11-07 DIAGNOSIS — Z362 Encounter for other antenatal screening follow-up: Secondary | ICD-10-CM | POA: Diagnosis not present

## 2021-11-07 DIAGNOSIS — Z3A24 24 weeks gestation of pregnancy: Secondary | ICD-10-CM | POA: Diagnosis not present

## 2021-12-05 DIAGNOSIS — Z3480 Encounter for supervision of other normal pregnancy, unspecified trimester: Secondary | ICD-10-CM | POA: Diagnosis not present

## 2021-12-05 DIAGNOSIS — Z23 Encounter for immunization: Secondary | ICD-10-CM | POA: Diagnosis not present

## 2022-01-05 DIAGNOSIS — M654 Radial styloid tenosynovitis [de Quervain]: Secondary | ICD-10-CM | POA: Diagnosis not present

## 2022-01-05 DIAGNOSIS — G5603 Carpal tunnel syndrome, bilateral upper limbs: Secondary | ICD-10-CM | POA: Diagnosis not present

## 2022-01-05 DIAGNOSIS — M65331 Trigger finger, right middle finger: Secondary | ICD-10-CM | POA: Diagnosis not present

## 2022-01-18 DIAGNOSIS — O26843 Uterine size-date discrepancy, third trimester: Secondary | ICD-10-CM | POA: Diagnosis not present

## 2022-01-18 DIAGNOSIS — Z3A34 34 weeks gestation of pregnancy: Secondary | ICD-10-CM | POA: Diagnosis not present

## 2022-01-24 DIAGNOSIS — Z3682 Encounter for antenatal screening for nuchal translucency: Secondary | ICD-10-CM | POA: Diagnosis not present

## 2022-01-24 LAB — OB RESULTS CONSOLE GBS: GBS: NEGATIVE

## 2022-02-13 ENCOUNTER — Telehealth (HOSPITAL_COMMUNITY): Payer: Self-pay | Admitting: *Deleted

## 2022-02-13 ENCOUNTER — Encounter (HOSPITAL_COMMUNITY): Payer: Self-pay | Admitting: *Deleted

## 2022-02-13 NOTE — Telephone Encounter (Signed)
Preadmission screen  

## 2022-02-14 DIAGNOSIS — O26843 Uterine size-date discrepancy, third trimester: Secondary | ICD-10-CM | POA: Diagnosis not present

## 2022-02-14 DIAGNOSIS — Z3A38 38 weeks gestation of pregnancy: Secondary | ICD-10-CM | POA: Diagnosis not present

## 2022-02-15 ENCOUNTER — Encounter (HOSPITAL_COMMUNITY): Payer: Self-pay

## 2022-02-15 NOTE — Patient Instructions (Signed)
Ryelee Albee  02/15/2022   Your procedure is scheduled on:  02/19/2022  Arrive at 11 at Entrance C on [address] at [facility]  and [employer] [address]. You are invited to use the FREE valet parking or use the Visitor's parking deck.  Pick up the phone at the desk and dial [phone].  Call this number if you have problems the morning of surgery: [phone]  Remember:   Do not eat food:(After Midnight) Desps de medianoche.  Do not drink clear liquids: (After Midnight) Desps de medianoche.  Take these medicines the morning of surgery with A SIP OF WATER:  none   Do not wear jewelry, make-up or nail polish.  Do not wear lotions, powders, or perfumes. Do not wear deodorant.  Do not shave 48 hours prior to surgery.  Do not bring valuables to the hospital.  [facility] is not   responsible for any belongings or valuables brought to the hospital.  Contacts, dentures or bridgework may not be worn into surgery.  Leave suitcase in the car. After surgery it may be brought to your room.  For patients admitted to the hospital, checkout time is 11:00 AM the day of              discharge.      Please read over the following fact sheets that you were given:     Preparing for Surgery

## 2022-02-15 NOTE — H&P (Signed)
Samantha Trujillo is a 42 y.o. female [phone] at 23 weeks presenting for primary C/S and BTL for suspected fetal macrosomia.  Most recent u/s on 1/17 with EFW 9#6 (4244g) with large AC.  Patient has h/o GHTN and has taken ASA x 2 this pregnancy; normotensive.  H/o SAB at 15 weeks prior to current pregnancy.  Patient has h/o PPD and mood has been stable during this pregnancy.  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  GBS negative.  OB History     Gravida  3   Para  2   Term  2   Preterm  0   AB  0   Living  1      SAB  0   IAB  0   Ectopic  0   Multiple  0   Live Births  1          Past Medical History:  Diagnosis Date   Medical history non-contributory    Vaginal Pap smear, abnormal    Varicose veins    Past Surgical History:  Procedure Laterality Date   DILATION AND CURETTAGE OF UTERUS N/A 11/23/2020   Procedure: DILATATION AND CURETTAGE;  Surgeon: Everlene Farrier, MD;  Location: [facility];  Service: Gynecology;  Laterality: N/A;   NO PAST SURGERIES     OPERATIVE ULTRASOUND N/A 11/23/2020   Procedure: OPERATIVE ULTRASOUND;  Surgeon: Everlene Farrier, MD;  Location: [facility];  Service: Gynecology;  Laterality: N/A;   Family History: family history is not on file. Social History:  reports that she has never smoked. She has never used smokeless tobacco. She reports that she does not drink alcohol and does not use drugs.     Maternal Diabetes: No Genetic Screening: Normal Maternal Ultrasounds/Referrals: Normal Fetal Ultrasounds or other Referrals:  None Maternal Substance Abuse:  No Significant Maternal Medications:  None Significant Maternal Lab Results:  Group B Strep negative Number of Prenatal Visits:greater than 3 verified prenatal visits Other Comments:  None  Review of Systems Maternal Medical History:  Prenatal complications: no prenatal complications Prenatal Complications - Diabetes: none.     unknown if currently  breastfeeding. Maternal Exam:  Abdomen: Patient reports no abdominal tenderness. Fundal height is S>D.   Estimated fetal weight is 10#.   Fetal presentation: vertex Pelvis: of concern for delivery.     Physical Exam Constitutional:      Appearance: Normal appearance.  HENT:     Head: Normocephalic and atraumatic.  Pulmonary:     Effort: Pulmonary effort is normal.  Abdominal:     Palpations: Abdomen is soft.  Musculoskeletal:        General: Normal range of motion.     Cervical back: Normal range of motion.  Skin:    General: Skin is warm and dry.  Neurological:     Mental Status: She is alert and oriented to person, place, and time.  Psychiatric:        Mood and Affect: Mood normal.        Behavior: Behavior normal.     Prenatal labs: ABO, Rh: A/Positive/-- (07/06 0000) Antibody: Negative (07/06 0000) Rubella: Immune (07/06 0000) RPR: Nonreactive (07/06 0000)  HBsAg: Negative (07/06 0000)  HIV: Non-reactive (07/06 0000)  GBS: Negative/-- (12/27 0000)   Assessment/Plan: 42yo [id number] at 39 weeks for primary C/S and BTL for suspected macrosomia and desire for sterility. Patient has been counseled re: suspected macrosomia and associated risk of failed IOL and shoulder dystocia.  She is informed of inaccuracy with u/s EFW estimate at this late gestational age.  Patient is informed of C/S risk to include bleeding, infection, scarring and damage to surrounding structures.  She is informed of risk of failure, regret and ectopic with BTL.  All questions were answered and patient wishes to proceed with C/S with BTL.     Linda Hedges 02/15/2022, 4:52 AM

## 2022-02-16 ENCOUNTER — Other Ambulatory Visit (HOSPITAL_COMMUNITY)
Admission: RE | Admit: 2022-02-16 | Discharge: 2022-02-16 | Disposition: A | Discharge status: Home / Self Care | Payer: Federal, State, Local not specified - PPO | Source: Ambulatory Visit | Attending: Obstetrics & Gynecology | Admitting: Obstetrics & Gynecology

## 2022-02-16 DIAGNOSIS — O09523 Supervision of elderly multigravida, third trimester: Secondary | ICD-10-CM | POA: Insufficient documentation

## 2022-02-16 DIAGNOSIS — O9832 Other infections with a predominantly sexual mode of transmission complicating childbirth: Secondary | ICD-10-CM | POA: Diagnosis not present

## 2022-02-16 DIAGNOSIS — A6000 Herpesviral infection of urogenital system, unspecified: Secondary | ICD-10-CM | POA: Diagnosis not present

## 2022-02-16 DIAGNOSIS — O3663X0 Maternal care for excessive fetal growth, third trimester, not applicable or unspecified: Secondary | ICD-10-CM | POA: Diagnosis not present

## 2022-02-16 DIAGNOSIS — Z3A39 39 weeks gestation of pregnancy: Secondary | ICD-10-CM | POA: Diagnosis not present

## 2022-02-16 DIAGNOSIS — Z302 Encounter for sterilization: Secondary | ICD-10-CM | POA: Diagnosis not present

## 2022-02-16 LAB — TYPE AND SCREEN
ABO/RH(D): A POS
Antibody Screen: NEGATIVE

## 2022-02-16 LAB — CBC
HCT: 38 % (ref 36.0–46.0)
Hemoglobin: 12.1 g/dL (ref 12.0–15.0)
MCH: 28.3 pg (ref 26.0–34.0)
MCHC: 31.8 g/dL (ref 30.0–36.0)
MCV: 89 fL (ref 80.0–100.0)
Platelets: 248 10*3/uL (ref 150–400)
RBC: 4.27 MIL/uL (ref 3.87–5.11)
RDW: 13.9 % (ref 11.5–15.5)
WBC: 5.4 10*3/uL (ref 4.0–10.5)
nRBC: 0 % (ref 0.0–0.2)

## 2022-02-16 LAB — RPR: RPR Ser Ql: NONREACTIVE

## 2022-02-18 ENCOUNTER — Encounter (HOSPITAL_COMMUNITY): Payer: Self-pay | Admitting: Obstetrics & Gynecology

## 2022-02-19 ENCOUNTER — Encounter (HOSPITAL_COMMUNITY): Payer: Self-pay | Admitting: Obstetrics & Gynecology

## 2022-02-19 ENCOUNTER — Inpatient Hospital Stay (HOSPITAL_COMMUNITY): Payer: Federal, State, Local not specified - PPO | Admitting: Anesthesiology

## 2022-02-19 ENCOUNTER — Encounter (HOSPITAL_COMMUNITY)
Admission: RE | Disposition: A | Discharge status: Home / Self Care | Payer: Self-pay | Source: Home / Self Care | Attending: Obstetrics & Gynecology

## 2022-02-19 ENCOUNTER — Other Ambulatory Visit: Payer: Self-pay

## 2022-02-19 ENCOUNTER — Inpatient Hospital Stay (HOSPITAL_COMMUNITY): Payer: Federal, State, Local not specified - PPO

## 2022-02-19 ENCOUNTER — Inpatient Hospital Stay (HOSPITAL_COMMUNITY)
Admission: RE | Admit: 2022-02-19 | Discharge: 2022-02-21 | DRG: 784 | Disposition: A | Discharge status: Home / Self Care | Payer: Federal, State, Local not specified - PPO | Attending: Obstetrics & Gynecology | Admitting: Obstetrics & Gynecology

## 2022-02-19 DIAGNOSIS — A6000 Herpesviral infection of urogenital system, unspecified: Secondary | ICD-10-CM | POA: Diagnosis present

## 2022-02-19 DIAGNOSIS — Z302 Encounter for sterilization: Secondary | ICD-10-CM

## 2022-02-19 DIAGNOSIS — O3660X0 Maternal care for excessive fetal growth, unspecified trimester, not applicable or unspecified: Secondary | ICD-10-CM | POA: Diagnosis present

## 2022-02-19 DIAGNOSIS — Z3A39 39 weeks gestation of pregnancy: Secondary | ICD-10-CM | POA: Diagnosis not present

## 2022-02-19 DIAGNOSIS — Z98891 History of uterine scar from previous surgery: Secondary | ICD-10-CM

## 2022-02-19 DIAGNOSIS — O9832 Other infections with a predominantly sexual mode of transmission complicating childbirth: Secondary | ICD-10-CM | POA: Diagnosis present

## 2022-02-19 DIAGNOSIS — O9902 Anemia complicating childbirth: Secondary | ICD-10-CM | POA: Diagnosis not present

## 2022-02-19 DIAGNOSIS — D649 Anemia, unspecified: Secondary | ICD-10-CM | POA: Diagnosis not present

## 2022-02-19 DIAGNOSIS — O3663X0 Maternal care for excessive fetal growth, third trimester, not applicable or unspecified: Secondary | ICD-10-CM | POA: Diagnosis not present

## 2022-02-19 DIAGNOSIS — Z23 Encounter for immunization: Secondary | ICD-10-CM | POA: Diagnosis not present

## 2022-02-19 SURGERY — Surgical Case
Anesthesia: Spinal | Laterality: Bilateral

## 2022-02-19 MED ORDER — MORPHINE SULFATE (PF) 0.5 MG/ML IJ SOLN
INTRAMUSCULAR | Status: AC
  Filled 2022-02-19: qty 10

## 2022-02-19 MED ORDER — ACETAMINOPHEN 10 MG/ML IV SOLN
INTRAVENOUS | Status: DC | PRN
  Administered 2022-02-19: 1000 mg via INTRAVENOUS

## 2022-02-19 MED ORDER — CEFAZOLIN SODIUM-DEXTROSE 2-4 GM/100ML-% IV SOLN
2.0000 g | INTRAVENOUS | Status: AC
  Administered 2022-02-19: 2 g via INTRAVENOUS

## 2022-02-19 MED ORDER — MEPERIDINE HCL 25 MG/ML IJ SOLN: 6.2500 mg | INTRAMUSCULAR | Status: DC | PRN

## 2022-02-19 MED ORDER — PHENYLEPHRINE HCL-NACL 20-0.9 MG/250ML-% IV SOLN
INTRAVENOUS | Status: AC
  Filled 2022-02-19: qty 250

## 2022-02-19 MED ORDER — SCOPOLAMINE 1 MG/3DAYS TD PT72
MEDICATED_PATCH | TRANSDERMAL | Status: DC | PRN
  Administered 2022-02-19: 1 via TRANSDERMAL

## 2022-02-19 MED ORDER — MORPHINE SULFATE (PF) 0.5 MG/ML IJ SOLN
INTRAMUSCULAR | Status: DC | PRN
  Administered 2022-02-19: 150 ug via INTRATHECAL

## 2022-02-19 MED ORDER — LACTATED RINGERS IV SOLN: INTRAVENOUS | Status: DC

## 2022-02-19 MED ORDER — MENTHOL 3 MG MT LOZG: 1.0000 | LOZENGE | OROMUCOSAL | Status: DC | PRN

## 2022-02-19 MED ORDER — DEXAMETHASONE SODIUM PHOSPHATE 4 MG/ML IJ SOLN
INTRAMUSCULAR | Status: AC
  Filled 2022-02-19: qty 2

## 2022-02-19 MED ORDER — CEFAZOLIN SODIUM-DEXTROSE 2-3 GM-%(50ML) IV SOLR: INTRAVENOUS | Status: DC | PRN

## 2022-02-19 MED ORDER — PHENYLEPHRINE 80 MCG/ML (10ML) SYRINGE FOR IV PUSH (FOR BLOOD PRESSURE SUPPORT)
PREFILLED_SYRINGE | INTRAVENOUS | Status: AC
  Filled 2022-02-19: qty 10

## 2022-02-19 MED ORDER — WITCH HAZEL-GLYCERIN EX PADS: 1.0000 | MEDICATED_PAD | CUTANEOUS | Status: DC | PRN

## 2022-02-19 MED ORDER — KETOROLAC TROMETHAMINE 30 MG/ML IJ SOLN: 30.0000 mg | Freq: Four times a day (QID) | INTRAMUSCULAR | Status: AC | PRN

## 2022-02-19 MED ORDER — PHENYLEPHRINE HCL (PRESSORS) 10 MG/ML IV SOLN
INTRAVENOUS | Status: DC | PRN
  Administered 2022-02-19: 160 ug via INTRAVENOUS

## 2022-02-19 MED ORDER — BUPIVACAINE IN DEXTROSE 0.75-8.25 % IT SOLN
INTRATHECAL | Status: DC | PRN
  Administered 2022-02-19: 1.6 mL via INTRATHECAL

## 2022-02-19 MED ORDER — DEXAMETHASONE SODIUM PHOSPHATE 10 MG/ML IJ SOLN
INTRAMUSCULAR | Status: DC | PRN
  Administered 2022-02-19: 8 mg via INTRAVENOUS

## 2022-02-19 MED ORDER — COCONUT OIL OIL: 1.0000 | TOPICAL_OIL | Status: DC | PRN

## 2022-02-19 MED ORDER — SODIUM CHLORIDE 0.9 % IR SOLN
Status: DC | PRN
  Administered 2022-02-19 (×2): 1

## 2022-02-19 MED ORDER — SIMETHICONE 80 MG PO CHEW: 80.0000 mg | CHEWABLE_TABLET | ORAL | Status: DC | PRN

## 2022-02-19 MED ORDER — ACETAMINOPHEN 500 MG PO TABS
1000.0000 mg | ORAL_TABLET | Freq: Four times a day (QID) | ORAL | Status: DC
  Administered 2022-02-20 – 2022-02-21 (×6): 1000 mg via ORAL
  Filled 2022-02-19 (×8): qty 2

## 2022-02-19 MED ORDER — POVIDONE-IODINE 10 % EX SWAB
2.0000 | Freq: Once | CUTANEOUS | Status: AC
  Administered 2022-02-19: 2 via TOPICAL

## 2022-02-19 MED ORDER — DIPHENHYDRAMINE HCL 25 MG PO CAPS: 25.0000 mg | ORAL_CAPSULE | Freq: Four times a day (QID) | ORAL | Status: DC | PRN

## 2022-02-19 MED ORDER — CEFAZOLIN SODIUM-DEXTROSE 2-4 GM/100ML-% IV SOLN
INTRAVENOUS | Status: AC
  Filled 2022-02-19: qty 100

## 2022-02-19 MED ORDER — DIPHENHYDRAMINE HCL 25 MG PO CAPS: 25.0000 mg | ORAL_CAPSULE | ORAL | Status: DC | PRN

## 2022-02-19 MED ORDER — METOCLOPRAMIDE HCL 5 MG/ML IJ SOLN
INTRAMUSCULAR | Status: DC | PRN
  Administered 2022-02-19: 10 mg via INTRAVENOUS

## 2022-02-19 MED ORDER — OXYTOCIN-SODIUM CHLORIDE 30-0.9 UT/500ML-% IV SOLN
INTRAVENOUS | Status: AC
  Filled 2022-02-19: qty 500

## 2022-02-19 MED ORDER — OXYTOCIN-SODIUM CHLORIDE 30-0.9 UT/500ML-% IV SOLN
2.5000 [IU]/h | INTRAVENOUS | Status: AC
  Administered 2022-02-19: 2.5 [IU]/h via INTRAVENOUS
  Filled 2022-02-19: qty 500

## 2022-02-19 MED ORDER — ONDANSETRON HCL 4 MG/2ML IJ SOLN: 4.0000 mg | Freq: Three times a day (TID) | INTRAMUSCULAR | Status: DC | PRN

## 2022-02-19 MED ORDER — ACETAMINOPHEN 10 MG/ML IV SOLN
INTRAVENOUS | Status: AC
  Filled 2022-02-19: qty 100

## 2022-02-19 MED ORDER — METOCLOPRAMIDE HCL 5 MG/ML IJ SOLN
INTRAMUSCULAR | Status: AC
  Filled 2022-02-19: qty 2

## 2022-02-19 MED ORDER — ZOLPIDEM TARTRATE 5 MG PO TABS: 5.0000 mg | ORAL_TABLET | Freq: Every evening | ORAL | Status: DC | PRN

## 2022-02-19 MED ORDER — FENTANYL CITRATE (PF) 100 MCG/2ML IJ SOLN
INTRAMUSCULAR | Status: DC | PRN
  Administered 2022-02-19: 15 ug via INTRATHECAL

## 2022-02-19 MED ORDER — FENTANYL CITRATE (PF) 100 MCG/2ML IJ SOLN
INTRAMUSCULAR | Status: AC
  Filled 2022-02-19: qty 2

## 2022-02-19 MED ORDER — PRENATAL MULTIVITAMIN CH
1.0000 | ORAL_TABLET | Freq: Every day | ORAL | Status: DC
  Administered 2022-02-20: 1 via ORAL
  Filled 2022-02-19: qty 1

## 2022-02-19 MED ORDER — PHENYLEPHRINE HCL-NACL 20-0.9 MG/250ML-% IV SOLN
INTRAVENOUS | Status: DC | PRN
  Administered 2022-02-19: 60 ug/min via INTRAVENOUS

## 2022-02-19 MED ORDER — NALOXONE HCL 4 MG/10ML IJ SOLN: 1.0000 ug/kg/h | INTRAVENOUS | Status: DC | PRN

## 2022-02-19 MED ORDER — SODIUM CHLORIDE 0.9% FLUSH: 3.0000 mL | INTRAVENOUS | Status: DC | PRN

## 2022-02-19 MED ORDER — DIPHENHYDRAMINE HCL 50 MG/ML IJ SOLN: 12.5000 mg | INTRAMUSCULAR | Status: DC | PRN

## 2022-02-19 MED ORDER — SENNOSIDES-DOCUSATE SODIUM 8.6-50 MG PO TABS
2.0000 | ORAL_TABLET | Freq: Every day | ORAL | Status: DC
  Administered 2022-02-20 – 2022-02-21 (×2): 2 via ORAL
  Filled 2022-02-19 (×2): qty 2

## 2022-02-19 MED ORDER — HYDROCODONE-ACETAMINOPHEN 5-325 MG PO TABS: 1.0000 | ORAL_TABLET | ORAL | Status: DC | PRN

## 2022-02-19 MED ORDER — DIBUCAINE (PERIANAL) 1 % EX OINT: 1.0000 | TOPICAL_OINTMENT | CUTANEOUS | Status: DC | PRN

## 2022-02-19 MED ORDER — ONDANSETRON HCL 4 MG/2ML IJ SOLN
INTRAMUSCULAR | Status: DC | PRN
  Administered 2022-02-19: 4 mg via INTRAVENOUS

## 2022-02-19 MED ORDER — ONDANSETRON HCL 4 MG/2ML IJ SOLN
INTRAMUSCULAR | Status: AC
  Filled 2022-02-19: qty 2

## 2022-02-19 MED ORDER — NALOXONE HCL 0.4 MG/ML IJ SOLN: 0.4000 mg | INTRAMUSCULAR | Status: DC | PRN

## 2022-02-19 MED ORDER — SIMETHICONE 80 MG PO CHEW
80.0000 mg | CHEWABLE_TABLET | Freq: Three times a day (TID) | ORAL | Status: DC
  Administered 2022-02-19 – 2022-02-21 (×5): 80 mg via ORAL
  Filled 2022-02-19 (×5): qty 1

## 2022-02-19 MED ORDER — IBUPROFEN 600 MG PO TABS
600.0000 mg | ORAL_TABLET | Freq: Four times a day (QID) | ORAL | Status: DC
  Administered 2022-02-19 – 2022-02-21 (×6): 600 mg via ORAL
  Filled 2022-02-19 (×6): qty 1

## 2022-02-19 MED ORDER — SCOPOLAMINE 1 MG/3DAYS TD PT72
MEDICATED_PATCH | TRANSDERMAL | Status: AC
  Filled 2022-02-19: qty 1

## 2022-02-19 MED ORDER — SCOPOLAMINE 1 MG/3DAYS TD PT72: 1.0000 | MEDICATED_PATCH | Freq: Once | TRANSDERMAL | Status: DC

## 2022-02-19 MED ORDER — OXYTOCIN-SODIUM CHLORIDE 30-0.9 UT/500ML-% IV SOLN
INTRAVENOUS | Status: DC | PRN
  Administered 2022-02-19: 400 mL via INTRAVENOUS

## 2022-02-19 SURGICAL SUPPLY — 32 items
ADH SKN CLS APL DERMABOND .7 (GAUZE/BANDAGES/DRESSINGS)
APL PRP STRL LF DISP 70% ISPRP (MISCELLANEOUS) ×2
APL SKNCLS STERI-STRIP NONHPOA (GAUZE/BANDAGES/DRESSINGS) ×1
BENZOIN TINCTURE PRP APPL 2/3 (GAUZE/BANDAGES/DRESSINGS) ×1 IMPLANT
CHLORAPREP W/TINT 26 (MISCELLANEOUS) ×2 IMPLANT
CLAMP UMBILICAL CORD (MISCELLANEOUS) ×1 IMPLANT
CLOTH BEACON ORANGE TIMEOUT ST (SAFETY) ×1 IMPLANT
DERMABOND ADVANCED .7 DNX12 (GAUZE/BANDAGES/DRESSINGS) IMPLANT
DRSG OPSITE POSTOP 4X10 (GAUZE/BANDAGES/DRESSINGS) ×1 IMPLANT
ELECT REM PT RETURN 9FT ADLT (ELECTROSURGICAL) ×1
ELECTRODE REM PT RTRN 9FT ADLT (ELECTROSURGICAL) ×1 IMPLANT
EXTRACTOR VACUUM KIWI (MISCELLANEOUS) IMPLANT
GLOVE BIO SURGEON STRL SZ 6 (GLOVE) ×1 IMPLANT
GLOVE BIOGEL PI IND STRL 6 (GLOVE) ×2 IMPLANT
GLOVE BIOGEL PI IND STRL 7.0 (GLOVE) ×1 IMPLANT
GOWN STRL REUS W/TWL LRG LVL3 (GOWN DISPOSABLE) ×2 IMPLANT
KIT ABG SYR 3ML LUER SLIP (SYRINGE) ×1 IMPLANT
NDL HYPO 25X5/8 SAFETYGLIDE (NEEDLE) ×1 IMPLANT
NEEDLE HYPO 25X5/8 SAFETYGLIDE (NEEDLE) ×1 IMPLANT
NS IRRIG 1000ML POUR BTL (IV SOLUTION) ×1 IMPLANT
PACK C SECTION WH (CUSTOM PROCEDURE TRAY) ×1 IMPLANT
PAD OB MATERNITY 4.3X12.25 (PERSONAL CARE ITEMS) ×1 IMPLANT
STRIP CLOSURE SKIN 1/2X4 (GAUZE/BANDAGES/DRESSINGS) IMPLANT
SUT CHROMIC 0 CTX 36 (SUTURE) ×3 IMPLANT
SUT MON AB 2-0 CT1 27 (SUTURE) ×1 IMPLANT
SUT PDS AB 0 CT1 27 (SUTURE) IMPLANT
SUT PLAIN 0 NONE (SUTURE) IMPLANT
SUT VIC AB 0 CT1 36 (SUTURE) IMPLANT
SUT VIC AB 4-0 KS 27 (SUTURE) IMPLANT
TOWEL OR 17X24 6PK STRL BLUE (TOWEL DISPOSABLE) ×1 IMPLANT
TRAY FOLEY W/BAG SLVR 14FR LF (SET/KITS/TRAYS/PACK) IMPLANT
WATER STERILE IRR 1000ML POUR (IV SOLUTION) ×1 IMPLANT

## 2022-02-19 NOTE — Transfer of Care (Signed)
Immediate Anesthesia Transfer of Care Note  Patient: Khylah Kendra  Procedure(s) Performed: PRIMARY CESAREAN SECTION WITH BILATERAL TUBAL LIGATION (Bilateral)  Patient Location: PACU  Anesthesia Type:Spinal  Level of Consciousness: awake, alert , and oriented  Airway & Oxygen Therapy: Patient Spontanous Breathing  Post-op Assessment: Report given to RN and Post -op Vital signs reviewed and stable  Post vital signs: Reviewed and stable  Last Vitals:  Vitals Value Taken Time  BP    Temp    Pulse 73 02/19/22 1536  Resp 16 02/19/22 1536  SpO2 100 % 02/19/22 1536  Vitals shown include unvalidated device data.  Last Pain:  Vitals:   02/19/22 1320  TempSrc: Axillary  PainSc:          Complications: No notable events documented.

## 2022-02-19 NOTE — Progress Notes (Signed)
No change to H&P.  Oumou Smead, DO 

## 2022-02-19 NOTE — Anesthesia Procedure Notes (Signed)
Spinal  Patient location during procedure: OR Start time: 02/19/2022 1:45 PM End time: 02/19/2022 1:51 PM Reason for block: surgical anesthesia Staffing Anesthesiologist: Janeece Riggers, MD Performed by: Janeece Riggers, MD Authorized by: Janeece Riggers, MD   Preanesthetic Checklist Completed: patient identified, IV checked, site marked, risks and benefits discussed, surgical consent, monitors and equipment checked, pre-op evaluation and timeout performed Spinal Block Patient position: sitting Prep: DuraPrep Patient monitoring: heart rate, cardiac monitor, continuous pulse ox and blood pressure Approach: midline Location: L3-4 Injection technique: single-shot Needle Needle type: Sprotte  Needle gauge: 24 G Needle length: 9 cm Assessment Sensory level: T4 Events: CSF return

## 2022-02-19 NOTE — Anesthesia Preprocedure Evaluation (Addendum)
Anesthesia Evaluation  Patient identified by MRN, date of birth, ID band Patient awake    Reviewed: Allergy & Precautions, NPO status , Patient's Chart, lab work & pertinent test results  History of Anesthesia Complications Negative for: history of anesthetic complications  Airway Mallampati: I  TM Distance: >3 FB Neck ROM: Full    Dental no notable dental hx. (+) Teeth Intact, Dental Advisory Given   Pulmonary neg pulmonary ROS   Pulmonary exam normal        Cardiovascular negative cardio ROS Normal cardiovascular exam     Neuro/Psych negative neurological ROS  negative psych ROS   GI/Hepatic negative GI ROS, Neg liver ROS,,,  Endo/Other  negative endocrine ROS    Renal/GU negative Renal ROS  negative genitourinary   Musculoskeletal negative musculoskeletal ROS (+)    Abdominal   Peds  Hematology  (+) Blood dyscrasia, anemia Hgb 10.5   Anesthesia Other Findings Day of surgery medications reviewed with patient.  Reproductive/Obstetrics (+) Pregnancy                               Anesthesia Physical Anesthesia Plan  ASA: 2  Anesthesia Plan: Spinal   Post-op Pain Management: Minimal or no pain anticipated   Induction: Intravenous  PONV Risk Score and Plan: 3 and Treatment may vary due to age or medical condition, Ondansetron, Dexamethasone, Midazolam and Scopolamine patch - Pre-op  Airway Management Planned: Simple Face Mask  Additional Equipment: None  Intra-op Plan:   Post-operative Plan: Extubation in OR  Informed Consent: I have reviewed the patients History and Physical, chart, labs and discussed the procedure including the risks, benefits and alternatives for the proposed anesthesia with the patient or authorized representative who has indicated his/her understanding and acceptance.     Dental advisory given  Plan Discussed with: CRNA and  Anesthesiologist  Anesthesia Plan Comments: (  )        Anesthesia Quick Evaluation

## 2022-02-19 NOTE — Anesthesia Postprocedure Evaluation (Signed)
Anesthesia Post Note  Patient: Samantha Trujillo  Procedure(s) Performed: PRIMARY CESAREAN SECTION WITH BILATERAL TUBAL LIGATION (Bilateral)     Patient location during evaluation: PACU Anesthesia Type: Spinal Level of consciousness: oriented and awake and alert Pain management: pain level controlled Vital Signs Assessment: post-procedure vital signs reviewed and stable Respiratory status: spontaneous breathing, respiratory function stable and patient connected to nasal cannula oxygen Cardiovascular status: blood pressure returned to baseline and stable Postop Assessment: no headache, no backache and no apparent nausea or vomiting Anesthetic complications: no  No notable events documented.  Last Vitals:  Vitals:   02/19/22 1853 02/19/22 2000  BP: 121/70 132/74  Pulse: 64 69  Resp: 18 18  Temp: 36.6 C 36.5 C  SpO2: 97% 99%    Last Pain:  Vitals:   02/19/22 2000  TempSrc: Oral  PainSc: 0-No pain   Pain Goal:                Epidural/Spinal Function Cutaneous sensation: Normal sensation (02/19/22 2000), Patient able to flex knees: Yes (02/19/22 2000), Patient able to lift hips off bed: Yes (02/19/22 2000), Back pain beyond tenderness at insertion site: No (02/19/22 2000), Progressively worsening motor and/or sensory loss: No (02/19/22 2000), Bowel and/or bladder incontinence post epidural: No (02/19/22 2000)  [name]

## 2022-02-19 NOTE — Op Note (Signed)
Samantha Trujillo PROCEDURE DATE: 02/19/2022  PREOPERATIVE DIAGNOSIS: Intrauterine pregnancy at  [redacted]w[redacted]d weeks gestation, suspected fetal macrosomia, desire for sterility  POSTOPERATIVE DIAGNOSIS: The same  PROCEDURE:  Primary Low Transverse Cesarean Section with Bilateral Tubal Ligation  SURGEON:  Dr. Linda Hedges  INDICATIONS: Samantha Trujillo is a 42 y.o. [id number] at [redacted]w[redacted]d scheduled for cesarean section and tubal ligation secondary to suspected fetal macrosomia and desire for sterility.  The risks of cesarean section discussed with the patient included but were not limited to: bleeding which may require transfusion or reoperation; infection which may require antibiotics; injury to bowel, bladder, ureters or other surrounding organs; injury to the fetus; need for additional procedures including hysterectomy in the event of a life-threatening hemorrhage; placental abnormalities wth subsequent pregnancies, incisional problems, thromboembolic phenomenon and other postoperative/anesthesia complications. The patient concurred with the proposed plan, giving informed written consent for the procedure.    FINDINGS:  Viable female infant in cephalic presentation, APGARs 9,9: weight pending  Meconium stained amniotic fluid.  Intact placenta, three vessel cord.  Grossly normal uterus, ovaries and fallopian tubes. .   ANESTHESIA:  Spinal ESTIMATED BLOOD LOSS: 552 ml SPECIMENS: Placenta sent to L&D, bilateral tubal segments sent to pathology COMPLICATIONS: None immediate  PROCEDURE IN DETAIL:  The patient received intravenous antibiotics and had sequential compression devices applied to her lower extremities while in the preoperative area.  She was then taken to the operating room where spinal anesthesia was administered and was found to be adequate. She was then placed in a dorsal supine position with a leftward tilt, and prepped and draped in a sterile manner.  A foley catheter was placed into her bladder  and attached to constant gravity.  After an adequate timeout was performed, a Pfannenstiel skin incision was made with scalpel and carried through to the underlying layer of fascia. The fascia was incised in the midline and this incision was extended bilaterally using the Mayo scissors. Kocher clamps were applied to the superior aspect of the fascial incision and the underlying rectus muscles were dissected off bluntly. A similar process was carried out on the inferior aspect of the facial incision. The rectus muscles were separated in the midline bluntly and the peritoneum was entered bluntly. Bladder flap was created sharply and developed bluntly.  Bladder blade was placed.  A transverse hysterotomy was made with a scalpel and extended bilaterally bluntly. The bladder blade was then removed. The infant was successfully delivered using a single Kiwi vacuum pull, and cord was clamped and cut and infant was handed over to awaiting neonatology team. Uterine massage was then administered and the placenta delivered intact with three-vessel cord. The uterus was cleared of clot and debris.  The uterus was exteriorized and hysterotomy was closed with 0 chromic.  A second imbricating suture of 0-chromic was used to reinforce the incision and aid in hemostasis.  Attention was turned to the fallopian tubes.  The right fallopian tube was elevated, doubly tied with plain gut suture and tubal knuckle excised.  Hemostasis was observed.  The contralateral tube was treated in the same fashion.  Hemostasis was observed bilaterally.  The uterus was placed into the abdomen and irrigation was performed.  Bilateral tubal sites were again inspected and hemostatic.  The peritoneum and rectus muscles were noted to be hemostatic and were reapproximated using 2-0 monocryl in a running fashion.  The fascia was closed with 0-Vicryl in a running fashion with good restoration of anatomy.  The subcutaneus tissue was copiously irrigated.  The skin  was closed with 4-0 vicryl in a subcuticular fashion.  Pt tolerated the procedure will.  All counts were correct x2.  Pt went to the recovery room in stable condition.

## 2022-02-20 ENCOUNTER — Encounter (HOSPITAL_COMMUNITY): Payer: Self-pay | Admitting: Obstetrics & Gynecology

## 2022-02-20 LAB — CBC
HCT: 32.2 % — ABNORMAL LOW (ref 36.0–46.0)
Hemoglobin: 10.4 g/dL — ABNORMAL LOW (ref 12.0–15.0)
MCH: 28.6 pg (ref 26.0–34.0)
MCHC: 32.3 g/dL (ref 30.0–36.0)
MCV: 88.5 fL (ref 80.0–100.0)
Platelets: 241 10*3/uL (ref 150–400)
RBC: 3.64 MIL/uL — ABNORMAL LOW (ref 3.87–5.11)
RDW: 13.7 % (ref 11.5–15.5)
WBC: 11.4 10*3/uL — ABNORMAL HIGH (ref 4.0–10.5)
nRBC: 0 % (ref 0.0–0.2)

## 2022-02-20 NOTE — Progress Notes (Signed)
Postpartum Progress Note  S: No complaints. Feeling well. Lochia appropriate. No subjective fevers/chills, Cp, or SOB. Ambulating. Catheter removed, voiding spontaneously. Pain well-controlled.  O:  Vitals:   02/20/22 0045 02/20/22 0500  BP:  119/65  Pulse:  69  Resp:  18  Temp:  98.4 F (36.9 C)  SpO2: 92% 98%    Gen: NAD, A&O Pulm: NWOB Abd: soft, appropriately ttp, fundus firm and below Umb. Incision c/d under honeycomb dressing Ext: No evidence of DVT, trace edema b/l  UOP adequate.   Labs  Recent Results (from the past 2160 hour(s))  OB RESULT CONSOLE Group B Strep     Status: None   Collection Time: 01/24/22 12:00 AM  Result Value Ref Range   GBS Negative   CBC     Status: None   Collection Time: 02/16/22 10:41 AM  Result Value Ref Range   WBC 5.4 4.0 - 10.5 K/uL   RBC 4.27 3.87 - 5.11 MIL/uL   Hemoglobin 12.1 12.0 - 15.0 g/dL   HCT 38.0 36.0 - 46.0 %   MCV 89.0 80.0 - 100.0 fL   MCH 28.3 26.0 - 34.0 pg   MCHC 31.8 30.0 - 36.0 g/dL   RDW 13.9 11.5 - 15.5 %   Platelets 248 150 - 400 K/uL   nRBC 0.0 0.0 - 0.2 %    Comment: Performed at [facility], [place] [address] [place], [place] [address]  RPR     Status: None   Collection Time: 02/16/22 10:41 AM  Result Value Ref Range   RPR Ser Ql NON REACTIVE NON REACTIVE    Comment: Performed at [facility], [place] [address], [place], [place] [address]  Type and screen [place]     Status: None   Collection Time: 02/16/22 10:45 AM  Result Value Ref Range   ABO/RH(D) A POS    Antibody Screen NEG    Sample Expiration      02/19/2022,2359 Performed at [facility], [place] [address], [place], [place] [address]   CBC     Status: Abnormal   Collection Time: 02/20/22  5:14 AM  Result Value Ref Range   WBC 11.4 (H) 4.0 - 10.5 K/uL   RBC 3.64 (L) 3.87 - 5.11 MIL/uL   Hemoglobin 10.4 (L) 12.0 - 15.0 g/dL   HCT 32.2 (L) 36.0 - 46.0 %   MCV 88.5 80.0 - 100.0 fL   MCH 28.6 26.0 - 34.0  pg   MCHC 32.3 30.0 - 36.0 g/dL   RDW 13.7 11.5 - 15.5 %   Platelets 241 150 - 400 K/uL   nRBC 0.0 0.0 - 0.2 %    Comment: Performed at [facility], [place] [address], [place],  [address]     A/P:  POD1 s/p pCS+BTL for suspected fetal macrosomia, doing well pp. AFVSS. Benign exam. Has history of gestational HTN in prior pregnancy (Bps during this pregnancy have been wnl), hx postpartum depression - for PHQ9 prior to discharge. Mood stable. Continue present care.  Plan for d/c POD#2 or 3.   Hurshel Party, MD

## 2022-02-20 NOTE — Lactation Note (Signed)
This note was copied from a baby's chart. Lactation Consultation Note  Patient Name: Samantha Trujillo [id number] Date: 02/20/2022 Reason for consult: Initial assessment;Term Age:40 hours  P3, term infant  Infant has formula fed well since birth. Mother states she has been latching "for little a while" on the left breast. She states she was not successful with breastfeeding in the past but would like to breastfeed this time. Baby is sleeping and just had 27 ml of formula in the last half hour. Taught hand expression, colostrum was not observed. Encouraged to place baby skin to skin, feed with cues and call RN for assistance, as needed. Mother has a breast pump for home and instructed mother to call if she wants to start pumping here at the hospital.  Maternal Data Has patient been taught Hand Expression?: Yes Does the patient have breastfeeding experience prior to this delivery?: No (attempted without success)  Feeding Nipple Type: Extra Slow Flow   Interventions Interventions: Education;LC Services brochure  Discharge Pump: DEBP;Personal (unsure of brand)  Consult Status Consult Status: Follow-up Date: 02/21/22 Follow-up type: In-patient   Stana Bunting M 02/20/2022, 1:24 PM

## 2022-02-21 MED ORDER — DOCUSATE SODIUM 100 MG PO CAPS: 100.0000 mg | ORAL_CAPSULE | Freq: Two times a day (BID) | ORAL | 2 refills | Status: DC

## 2022-02-21 MED ORDER — IBUPROFEN 600 MG PO TABS: 600.0000 mg | ORAL_TABLET | Freq: Four times a day (QID) | ORAL | 0 refills | Status: DC | PRN

## 2022-02-21 MED ORDER — OXYCODONE HCL 5 MG PO TABS: 5.0000 mg | ORAL_TABLET | ORAL | 0 refills | Status: DC | PRN

## 2022-02-21 NOTE — Discharge Summary (Signed)
Postpartum Discharge Summary  Date of Service updated 02/21/22     Patient Name: Samantha Trujillo DOB: December 26, 1980 MRN: [id number]  Date of admission: 02/19/2022 Delivery date:02/19/2022  Delivering provider: Linda Hedges  Date of discharge: 02/21/2022  Admitting diagnosis: S/P cesarean section [Z98.891] Fetal macrosomia during pregnancy [O36.60X0] Intrauterine pregnancy: [redacted]w[redacted]d     Secondary diagnosis:  Principal Problem:   S/P cesarean section Active Problems:   Fetal macrosomia during pregnancy  Additional problems: None    Discharge diagnosis: Term Pregnancy Delivered                                              Post partum procedures: None Augmentation: N/A Complications: None  Hospital course: Sceduled C/S   42 y.o. yo [id number] at [redacted]w[redacted]d was admitted to the hospital 02/19/2022 for scheduled cesarean section with the following indication:Macrosomia.Delivery details are as follows:  Membrane Rupture Time/Date: 2:21 PM ,02/19/2022   Delivery Method:C-Section, Vacuum Assisted with BTL. Details of operation can be found in separate operative note.  Patient had a postpartum course complicated bynone.  She is ambulating, tolerating a regular diet, passing flatus, and urinating well. Patient is discharged home in stable condition on  02/21/22        Newborn Data: Birth date:02/19/2022  Birth time:2:22 PM  Gender:Female  Living status:Living  Apgars:9 ,9  Weight:4150 g     Magnesium Sulfate received: No BMZ received: No Rhophylac:N/A MMR:N/A T-DaP:Given prenatally Flu: N/A Transfusion:No  Physical exam  Vitals:   02/20/22 0956 02/20/22 1637 02/20/22 2041 02/21/22 0632  BP: 127/71 120/66 121/72 134/85  Pulse: 64 76 80 79  Resp: 18 18 16 19   Temp: 98.2 F (36.8 C) 97.7 F (36.5 C) 98.1 F (36.7 C) 98.2 F (36.8 C)  TempSrc: Oral Oral Oral Oral  SpO2: 99% 97% 98% 99%   General: alert Lochia: appropriate Uterine Fundus: firm Incision: Healing well with no  significant drainage DVT Evaluation: No evidence of DVT seen on physical exam. Labs: Lab Results  Component Value Date   WBC 11.4 (H) 02/20/2022   HGB 10.4 (L) 02/20/2022   HCT 32.2 (L) 02/20/2022   MCV 88.5 02/20/2022   PLT 241 02/20/2022       No data to display         Edinburgh Score:    02/19/2022    5:41 PM  Edinburgh Postnatal Depression Scale Screening Tool  I have been able to laugh and see the funny side of things. 0  I have looked forward with enjoyment to things. 0  I have blamed myself unnecessarily when things went wrong. 0  I have been anxious or worried for no good reason. 0  I have felt scared or panicky for no good reason. 0  Things have been getting on top of me. 0  I have been so unhappy that I have had difficulty sleeping. 0  I have felt sad or miserable. 0  I have been so unhappy that I have been crying. 0  The thought of harming myself has occurred to me. 0  Edinburgh Postnatal Depression Scale Total 0      After visit meds:  Allergies as of 02/21/2022       Reactions   Augmentin [amoxicillin-pot Clavulanate] Other (See Comments)   dizzy   Covid-19 Mrna Vacc (moderna)    Patient is allergic to  ALL COVID 19 vaccines   Codeine Rash   Zithromax [azithromycin] Rash        Medication List     STOP taking these medications    aspirin EC 81 MG tablet       TAKE these medications    docusate sodium 100 MG capsule Commonly known as: Colace Take 1 capsule (100 mg total) by mouth 2 (two) times daily.   ibuprofen 600 MG tablet Commonly known as: ADVIL Take 1 tablet (600 mg total) by mouth every 6 (six) hours as needed.   oxyCODONE 5 MG immediate release tablet Commonly known as: Oxy IR/ROXICODONE Take 1 tablet (5 mg total) by mouth every 4 (four) hours as needed for severe pain.   prenatal multivitamin Tabs tablet Take 1 tablet by mouth daily at 12 noon.         Discharge home in stable condition Infant Feeding: Bottle and  Breast Infant Disposition:home with mother Discharge instruction: per After Visit Summary and Postpartum booklet. Activity: Advance as tolerated. Pelvic rest for 6 weeks.  Diet: routine diet Anticipated Birth Control:  BTL done at CS Postpartum Appointment:6 weeks Additional Postpartum F/U:  None Future Appointments:No future appointments. Follow up Visit:      02/21/2022 Tyson Dense, MD

## 2022-02-23 LAB — SURGICAL PATHOLOGY

## 2022-02-24 ENCOUNTER — Encounter (HOSPITAL_COMMUNITY): Payer: Self-pay | Admitting: Obstetrics and Gynecology

## 2022-02-24 ENCOUNTER — Other Ambulatory Visit: Payer: Self-pay

## 2022-02-24 ENCOUNTER — Inpatient Hospital Stay (HOSPITAL_COMMUNITY)
Admission: AD | Admit: 2022-02-24 | Discharge: 2022-02-24 | Disposition: A | Discharge status: Home / Self Care | Payer: Federal, State, Local not specified - PPO | Attending: Obstetrics and Gynecology | Admitting: Obstetrics and Gynecology

## 2022-02-24 DIAGNOSIS — O165 Unspecified maternal hypertension, complicating the puerperium: Secondary | ICD-10-CM | POA: Insufficient documentation

## 2022-02-24 DIAGNOSIS — O909 Complication of the puerperium, unspecified: Secondary | ICD-10-CM | POA: Insufficient documentation

## 2022-02-24 DIAGNOSIS — R519 Headache, unspecified: Secondary | ICD-10-CM | POA: Diagnosis not present

## 2022-02-24 DIAGNOSIS — I10 Essential (primary) hypertension: Secondary | ICD-10-CM | POA: Diagnosis not present

## 2022-02-24 LAB — COMPREHENSIVE METABOLIC PANEL
ALT: 22 U/L (ref 0–44)
AST: 20 U/L (ref 15–41)
Albumin: 2.2 g/dL — ABNORMAL LOW (ref 3.5–5.0)
Alkaline Phosphatase: 98 U/L (ref 38–126)
Anion gap: 6 (ref 5–15)
BUN: 6 mg/dL (ref 6–20)
CO2: 26 mmol/L (ref 22–32)
Calcium: 8.5 mg/dL — ABNORMAL LOW (ref 8.9–10.3)
Chloride: 107 mmol/L (ref 98–111)
Creatinine, Ser: 0.7 mg/dL (ref 0.44–1.00)
GFR, Estimated: >60 mL/min (ref >60)
Glucose, Bld: 94 mg/dL (ref 70–99)
Potassium: 4.2 mmol/L (ref 3.5–5.1)
Sodium: 139 mmol/L (ref 135–145)
Total Bilirubin: 0.6 mg/dL (ref 0.3–1.2)
Total Protein: 5.4 g/dL — ABNORMAL LOW (ref 6.5–8.1)

## 2022-02-24 LAB — CBC WITH DIFFERENTIAL/PLATELET
Abs Immature Granulocytes: 0.07 10*3/uL (ref 0.00–0.07)
Basophils Absolute: 0 10*3/uL (ref 0.0–0.1)
Basophils Relative: 0 %
Eosinophils Absolute: 0.2 10*3/uL (ref 0.0–0.5)
Eosinophils Relative: 2 %
HCT: 32.7 % — ABNORMAL LOW (ref 36.0–46.0)
Hemoglobin: 10.9 g/dL — ABNORMAL LOW (ref 12.0–15.0)
Immature Granulocytes: 1 %
Lymphocytes Relative: 10 %
Lymphs Abs: 0.9 10*3/uL (ref 0.7–4.0)
MCH: 29 pg (ref 26.0–34.0)
MCHC: 33.3 g/dL (ref 30.0–36.0)
MCV: 87 fL (ref 80.0–100.0)
Monocytes Absolute: 0.6 10*3/uL (ref 0.1–1.0)
Monocytes Relative: 7 %
Neutro Abs: 6.7 10*3/uL (ref 1.7–7.7)
Neutrophils Relative %: 80 %
Platelets: 348 10*3/uL (ref 150–400)
RBC: 3.76 MIL/uL — ABNORMAL LOW (ref 3.87–5.11)
RDW: 13.4 % (ref 11.5–15.5)
WBC: 8.4 10*3/uL (ref 4.0–10.5)
nRBC: 0 % (ref 0.0–0.2)

## 2022-02-24 MED ORDER — NIFEDIPINE ER OSMOTIC RELEASE 30 MG PO TB24: 30.0000 mg | ORAL_TABLET | Freq: Every day | ORAL | 11 refills | Status: DC

## 2022-02-24 MED ORDER — NIFEDIPINE ER OSMOTIC RELEASE 30 MG PO TB24
30.0000 mg | ORAL_TABLET | Freq: Once | ORAL | Status: AC
  Administered 2022-02-24: 30 mg via ORAL
  Filled 2022-02-24: qty 1

## 2022-02-24 NOTE — Discharge Instructions (Signed)
You were evaluated today for high blood pressure. Your blood pressure this morning was elevated but thankfully not to an emergent level.

## 2022-02-24 NOTE — MAU Note (Signed)
Samantha Trujillo is a 42 y.o. at Unknown here in MAU reporting: she has been taking BP @ home and it's been elevated.  Also reports having a HA on Thursday, which prompted her to take her BP.  Denies current HA or visual disturbances.   S/P Cesarean Section 02/19/2022, denies BP issues during pregnancy. LMP: NA Onset of complaint: Thursday Pain score: 5 incision Vitals:   02/24/22 0904  BP: (!) 146/94  Pulse: 80  Resp: 16  Temp: 99.1 F (37.3 C)  SpO2: 97%     FHT:NA Lab orders placed from triage:   None

## 2022-02-24 NOTE — MAU Provider Note (Signed)
History     CSN: [id number]  Arrival date and time: 02/24/22 4315   Event Date/Time   First Provider Initiated Contact with Patient 02/24/22 1037      Chief Complaint  Patient presents with   Hypertension   Samantha Trujillo , a  42 y.o. [id number] at 6 days PP presents to MAU with complaints of elevated BPs at home and a headache. Patient reports a headache yesterday that was relieved with PO Ibuprofen. She checked her BP yesterday and noted a BP of 150's /80-90s. She states that she took her bp again today and noted a 160/100 bp and came to ED. She denies a headache currently and denies having a headache today. She denies visual changes and epigastric pain. She endorses bilateral pedal and ankle edema that has not improved since delivery, but denies swelling becoming worse. Patient endorses a good support system with new baby and endorses rest and good water/food intake. She denies cold and flu-like symptoms.   Patient was sent over from ED. Labs drawn over there.         OB History     Gravida  5   Para  3   Term  3   Preterm  0   AB  2   Living  3      SAB  2   IAB  0   Ectopic  0   Multiple  0   Live Births  2           Past Medical History:  Diagnosis Date   Medical history non-contributory    Vaginal Pap smear, abnormal    Varicose veins     Past Surgical History:  Procedure Laterality Date   CESAREAN SECTION WITH BILATERAL TUBAL LIGATION Bilateral 02/19/2022   Procedure: PRIMARY CESAREAN SECTION WITH BILATERAL TUBAL LIGATION;  Surgeon: Linda Hedges, DO;  Location: [facility];  Service: Obstetrics;  Laterality: Bilateral;   DILATION AND CURETTAGE OF UTERUS N/A 11/23/2020   Procedure: DILATATION AND CURETTAGE;  Surgeon: Everlene Farrier, MD;  Location: [facility];  Service: Gynecology;  Laterality: N/A;   NO PAST SURGERIES     OPERATIVE ULTRASOUND N/A 11/23/2020   Procedure: OPERATIVE ULTRASOUND;  Surgeon: Everlene Farrier, MD;  Location: [facility];   Service: Gynecology;  Laterality: N/A;    History reviewed. No pertinent family history.  Social History   Tobacco Use   Smoking status: Never   Smokeless tobacco: Never  Vaping Use   Vaping Use: Never used  Substance Use Topics   Alcohol use: No   Drug use: No    Allergies:  Allergies  Allergen Reactions   Augmentin [Amoxicillin-Pot Clavulanate] Other (See Comments)    dizzy   Covid-19 Mrna Vacc (Moderna)     Patient is allergic to ALL COVID 19 vaccines   Codeine Rash   Zithromax [Azithromycin] Rash    Medications Prior to Admission  Medication Sig Dispense Refill Last Dose   docusate sodium (COLACE) 100 MG capsule Take 1 capsule (100 mg total) by mouth 2 (two) times daily. 60 capsule 2 02/23/2022   ibuprofen (ADVIL) 600 MG tablet Take 1 tablet (600 mg total) by mouth every 6 (six) hours as needed. 30 tablet 0 02/23/2022   Prenatal Vit-Fe Fumarate-FA (PRENATAL MULTIVITAMIN) TABS tablet Take 1 tablet by mouth daily at 12 noon.   02/23/2022   oxyCODONE (OXY IR/ROXICODONE) 5 MG immediate release tablet Take 1 tablet (5 mg total) by mouth every 4 (four) hours as needed  for severe pain. 15 tablet 0 Unknown    Review of Systems  Constitutional:  Negative for chills, fatigue and fever.  Eyes:  Negative for photophobia, pain and visual disturbance.  Respiratory:  Negative for apnea, shortness of breath and wheezing.   Cardiovascular:  Negative for chest pain and palpitations.  Gastrointestinal:  Negative for abdominal pain, constipation, diarrhea, nausea and vomiting.  Genitourinary:  Negative for difficulty urinating, dysuria, pelvic pain, vaginal bleeding, vaginal discharge and vaginal pain.  Musculoskeletal:  Negative for back pain.  Neurological:  Positive for headaches. Negative for seizures, weakness, light-headedness and numbness.  Psychiatric/Behavioral:  Negative for suicidal ideas.    Physical Exam   Blood pressure (!) 150/85, pulse 70, temperature 98.5 F (36.9 C),  temperature source Oral, resp. rate 18, height 5\' 6"  (1.676 m), weight 99.7 kg, SpO2 100 %, currently breastfeeding.  Physical Exam Vitals and nursing note reviewed.  Constitutional:      General: She is not in acute distress.    Appearance: Normal appearance.  HENT:     Head: Normocephalic.  Cardiovascular:     Rate and Rhythm: Normal rate and regular rhythm.  Pulmonary:     Effort: Pulmonary effort is normal.  Musculoskeletal:     Cervical back: Normal range of motion.     Comments: Bilateral +1 pitting edema noted to feet and ankles. DTRs normal. No Clonus   Skin:    General: Skin is warm and dry.     Capillary Refill: Capillary refill takes less than 2 seconds.  Neurological:     Mental Status: She is alert and oriented to person, place, and time.  Psychiatric:        Mood and Affect: Mood normal.   Patient Vitals for the past 24 hrs:  BP Temp Temp src Pulse Resp SpO2 Height Weight  02/24/22 1035 (!) 150/85 -- -- 70 18 -- -- --  02/24/22 1012 (!) 156/91 98.5 F (36.9 C) Oral 61 18 100 % -- --  02/24/22 1005 -- -- -- -- -- -- 5\' 6"  (1.676 m) 99.7 kg  02/24/22 0904 (!) 146/94 99.1 F (37.3 C) -- 80 16 97 % -- --     MAU Course  Procedures Labs collected in ED  Results for orders placed or performed during the hospital encounter of 02/24/22 (from the past 24 hour(s))  CBC with Differential     Status: Abnormal   Collection Time: 02/24/22  9:50 AM  Result Value Ref Range   WBC 8.4 4.0 - 10.5 K/uL   RBC 3.76 (L) 3.87 - 5.11 MIL/uL   Hemoglobin 10.9 (L) 12.0 - 15.0 g/dL   HCT 02/26/22 (L) 02/26/22 - 84.1 %   MCV 87.0 80.0 - 100.0 fL   MCH 29.0 26.0 - 34.0 pg   MCHC 33.3 30.0 - 36.0 g/dL   RDW 32.4 40.1 - 02.7 %   Platelets 348 150 - 400 K/uL   nRBC 0.0 0.0 - 0.2 %   Neutrophils Relative % 80 %   Neutro Abs 6.7 1.7 - 7.7 K/uL   Lymphocytes Relative 10 %   Lymphs Abs 0.9 0.7 - 4.0 K/uL   Monocytes Relative 7 %   Monocytes Absolute 0.6 0.1 - 1.0 K/uL   Eosinophils  Relative 2 %   Eosinophils Absolute 0.2 0.0 - 0.5 K/uL   Basophils Relative 0 %   Basophils Absolute 0.0 0.0 - 0.1 K/uL   Immature Granulocytes 1 %   Abs Immature Granulocytes 0.07 0.00 -  0.07 K/uL  Comprehensive metabolic panel     Status: Abnormal   Collection Time: 02/24/22  9:50 AM  Result Value Ref Range   Sodium 139 135 - 145 mmol/L   Potassium 4.2 3.5 - 5.1 mmol/L   Chloride 107 98 - 111 mmol/L   CO2 26 22 - 32 mmol/L   Glucose, Bld 94 70 - 99 mg/dL   BUN 6 6 - 20 mg/dL   Creatinine, Ser 0.70 0.44 - 1.00 mg/dL   Calcium 8.5 (L) 8.9 - 10.3 mg/dL   Total Protein 5.4 (L) 6.5 - 8.1 g/dL   Albumin 2.2 (L) 3.5 - 5.0 g/dL   AST 20 15 - 41 U/L   ALT 22 0 - 44 U/L   Alkaline Phosphatase 98 38 - 126 U/L   Total Bilirubin 0.6 0.3 - 1.2 mg/dL   GFR, Estimated >60 >60 mL/min   Anion gap 6 5 - 15    MDM - No headache today and relieved with PO Ibuprofen.  - PreE Labs normal  - Elevated BPs in ED and MAU no severe range.  - Recommendation for a PO Procardia with a 1 week BP check.  Consulted Dr. Helane Rima on Plan of care. Reviewed patient presentation and current clinical picture. MD agreeable to plan of care. Per MD Initiate Procardia 30 daily starting first dose in MAU and follow up with a BP check in the office on Monday.  - Orders placed and plan for discharge.   Assessment and Plan   1. Hypertension, unspecified type   2. Acute nonintractable headache, unspecified headache type   3. Postpartum care and examination    - Worsening signs and return precautions reviewed in depth.  - Rx for Procardia sent to outpatient pharmacy for pickup  - Recommended to Contact office first thing monday morning for BP check.  - Patient discharged home in stable condition and may return to MAU as needed.   Jacquiline Doe, MSN CNM  02/24/2022, 10:37 AM

## 2022-02-24 NOTE — ED Provider Notes (Signed)
[facility] Provider Note   CSN: [id number] Arrival date & time: 02/24/22  5573     History  Chief Complaint  Patient presents with   Hypertension    Samantha Trujillo is a 42 y.o. female. Patient presents to the emergency department complaining of high blood pressure. She reports that she checked her blood pressure at home this morning and it was 152/70. Upon arrival her blood pressure was 146/94. Patient states that for the past two days she has been experiencing headaches.  Ibuprofen has relieved headaches at home.  The patient had a cesarean section on Monday and was discharged on Wednesday.  Blood pressures at time of discharge were within the normal range.  HPI     Home Medications Prior to Admission medications   Medication Sig Start Date End Date Taking? Authorizing Provider  docusate sodium (COLACE) 100 MG capsule Take 1 capsule (100 mg total) by mouth 2 (two) times daily. 02/21/22   Tyson Dense, MD  ibuprofen (ADVIL) 600 MG tablet Take 1 tablet (600 mg total) by mouth every 6 (six) hours as needed. 02/21/22   Tyson Dense, MD  oxyCODONE (OXY IR/ROXICODONE) 5 MG immediate release tablet Take 1 tablet (5 mg total) by mouth every 4 (four) hours as needed for severe pain. 02/21/22   Tyson Dense, MD  Prenatal Vit-Fe Fumarate-FA (PRENATAL MULTIVITAMIN) TABS tablet Take 1 tablet by mouth daily at 12 noon.    [provider]      Allergies    Augmentin [amoxicillin-pot clavulanate], Covid-19 mrna vacc (moderna), Codeine, and Zithromax [azithromycin]    Review of Systems   Review of Systems  Neurological:  Positive for headaches.    Physical Exam Updated Vital Signs BP (!) 146/94   Pulse 80   Temp 99.1 F (37.3 C)   Resp 16   SpO2 97%  Physical Exam HENT:     Head: Normocephalic and atraumatic.  Eyes:     Conjunctiva/sclera: Conjunctivae normal.  Cardiovascular:     Rate and Rhythm:  Normal rate.  Pulmonary:     Effort: Pulmonary effort is normal. No respiratory distress.  Musculoskeletal:        General: No signs of injury.     Cervical back: Normal range of motion.  Skin:    General: Skin is dry.  Neurological:     Mental Status: She is alert.  Psychiatric:        Speech: Speech normal.        Behavior: Behavior normal.     ED Results / Procedures / Treatments   Labs (all labs ordered are listed, but only abnormal results are displayed) Labs Reviewed  CBC WITH DIFFERENTIAL/PLATELET  COMPREHENSIVE METABOLIC PANEL  URINALYSIS, ROUTINE W REFLEX MICROSCOPIC    EKG None  Radiology No results found.  Procedures Procedures    Medications Ordered in ED Medications - No data to display  ED Course/ Medical Decision Making/ A&P                             Medical Decision Making  Patient presents with a chief complaint of headache and hypertension in the setting of recent delivery/pregnancy.  Differential diagnosis includes preeclampsia, eclampsia,, tension type headache, and others  IV the patient's past medical history.  The patient had cesarean section on Monday  I initially ordered basic labs including CBC, CMP, and urinalysis  I requested consultation  with APP in the maternal assessment unit.  They recommended transfer to their department for further evaluation and management as needed.  Patient transferred to MAU for further assessment.        Final Clinical Impression(s) / ED Diagnoses Final diagnoses:  Hypertension, unspecified type  Acute nonintractable headache, unspecified headache type    Rx / DC Orders ED Discharge Orders     None         Ronny Bacon 02/24/22 [place], [place], DO 02/24/22 1016

## 2022-02-24 NOTE — ED Triage Notes (Signed)
Patient here with complaint of measuring her blood pressure at home to be 152/70 today, no history of hypertension. BP 146/94 in triage. Patient denies blurred vision, denies headache today, denies any other complaints. Patient is alert, oriented, and in no apparent distress at this time.

## 2022-02-27 ENCOUNTER — Telehealth (HOSPITAL_COMMUNITY): Payer: Self-pay

## 2022-02-27 NOTE — Telephone Encounter (Signed)
Patient reports feeling good. "I had to come back on Saturday because of my BP. I followed up with my OB yesterday and I have an appointment again tomorrow. They said my incision looked good." Patient declines questions/concerns about her health and healing.  Patient reports that baby is doing well. "She's doing good. She went to her pediatrician and we have another weight check on Friday. She eats, sleeps, and poops well." Baby sleeps in a bassinet. RN reviewed ABC's of safe sleep with patient. Patient declines any questions or concerns about baby.  EPDS score is 2.  Sharyn Lull [facility] 02/27/22,1559

## 2022-03-05 DIAGNOSIS — M5489 Other dorsalgia: Secondary | ICD-10-CM | POA: Diagnosis not present

## 2022-04-02 DIAGNOSIS — Z1389 Encounter for screening for other disorder: Secondary | ICD-10-CM | POA: Diagnosis not present

## 2022-10-21 IMAGING — US US PELVIS COMPLETE
1 series · 15 of 25 positions shown · non-contrast
Comparison: No priors.

CLINICAL DATA: 39-year-old female status post vaginal delivery at
[DATE] a.m. with delayed delivery of the placenta (removed by forceps
4 hours later).

EXAM:
TRANSABDOMINAL ULTRASOUND OF PELVIS
DOPPLER ULTRASOUND OF OVARIES
TECHNIQUE: Transabdominal ultrasound examination of the pelvis was performed
including evaluation of the uterus, ovaries, adnexal regions, and
pelvic cul-de-sac.
Color and duplex Doppler ultrasound was utilized to evaluate blood
flow to the ovaries.

[Series 1: us pelvis complete · 56 acquisitions, 15 frames shown]
[im 1/56]
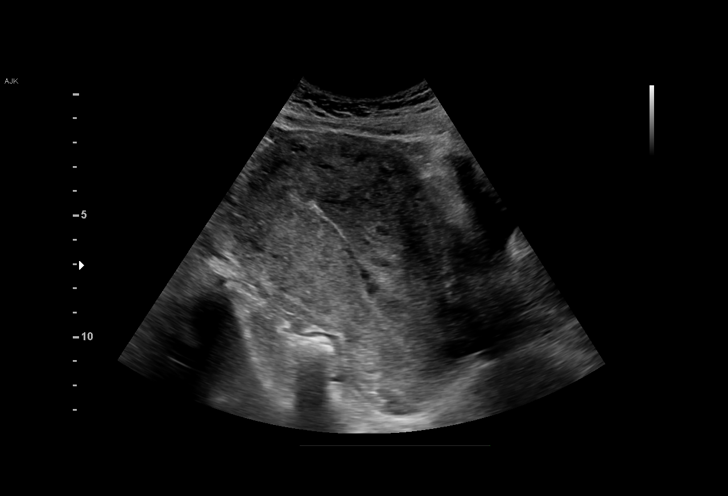
[im 5/56]
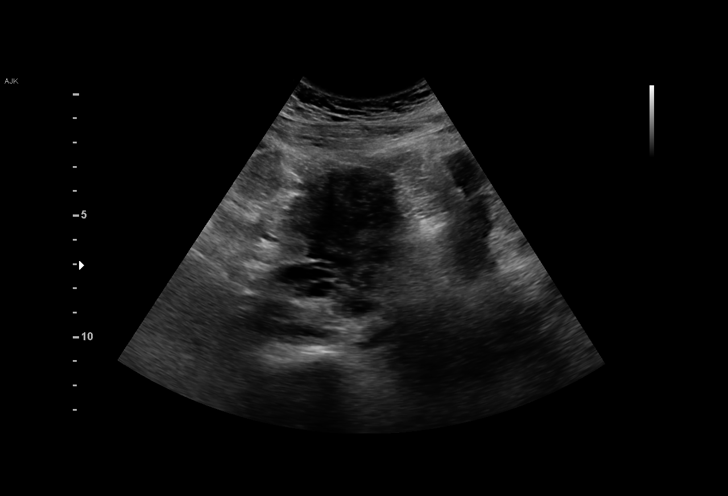
[im 10/56]
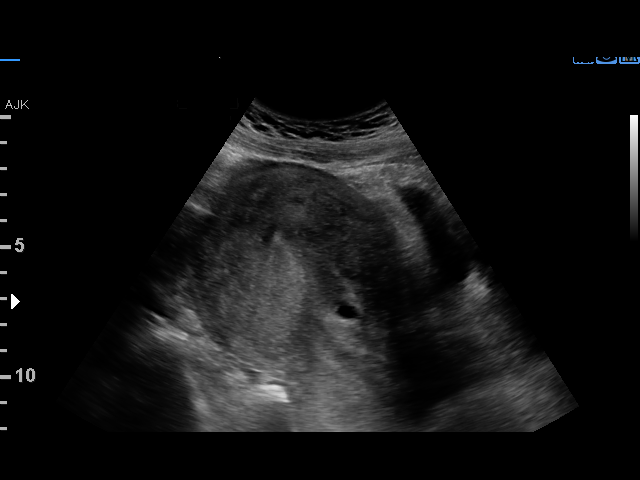
[im 12/56]
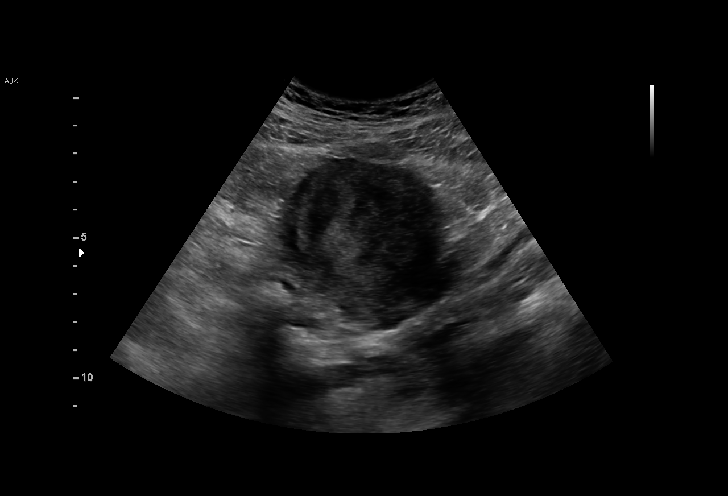
[im 17/56]
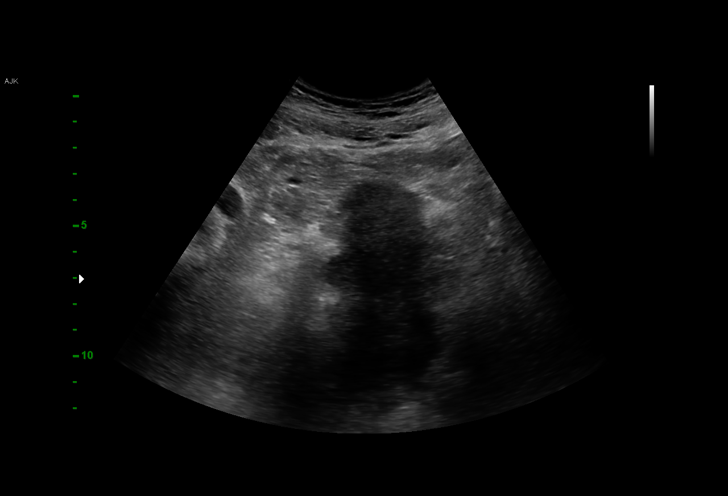
[im 21/56]
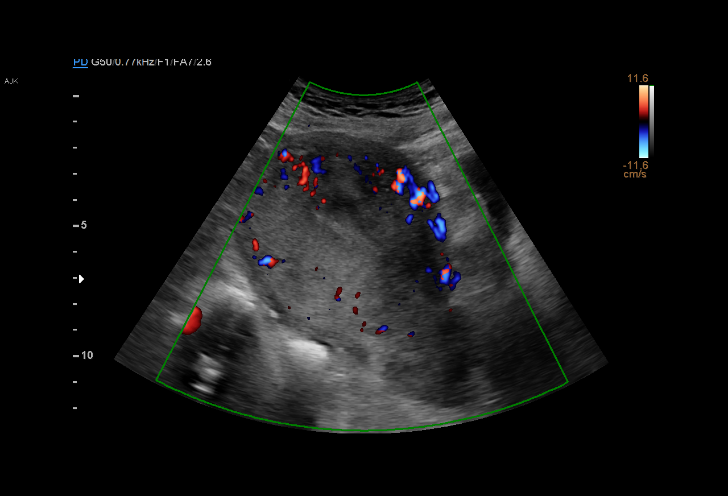
[im 23/56]
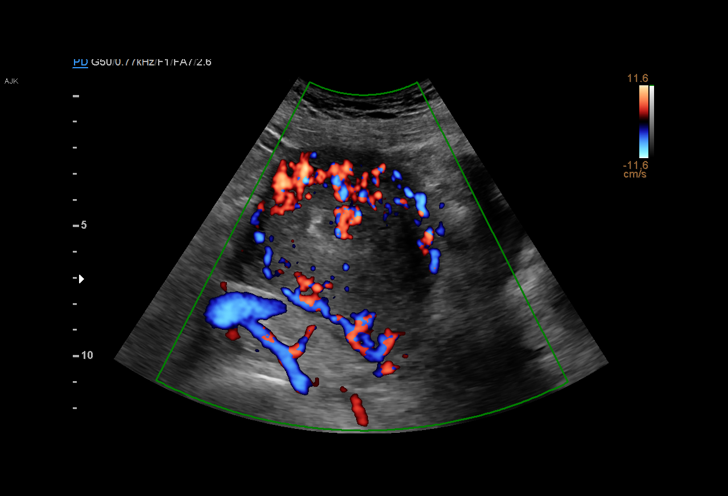
[im 28/56]
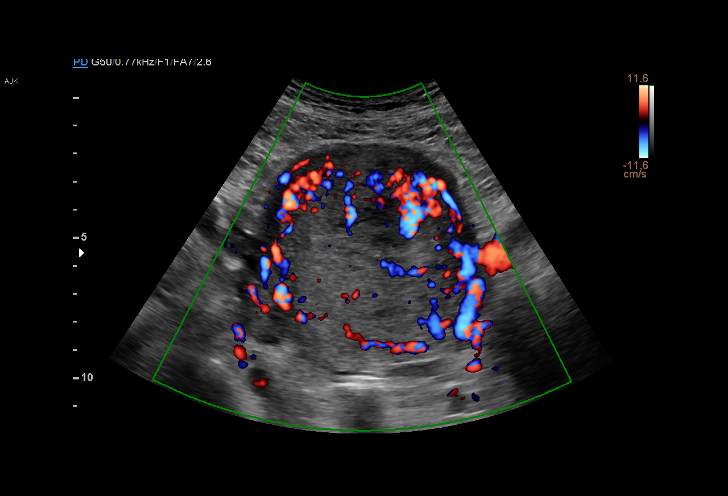
[im 33/56]
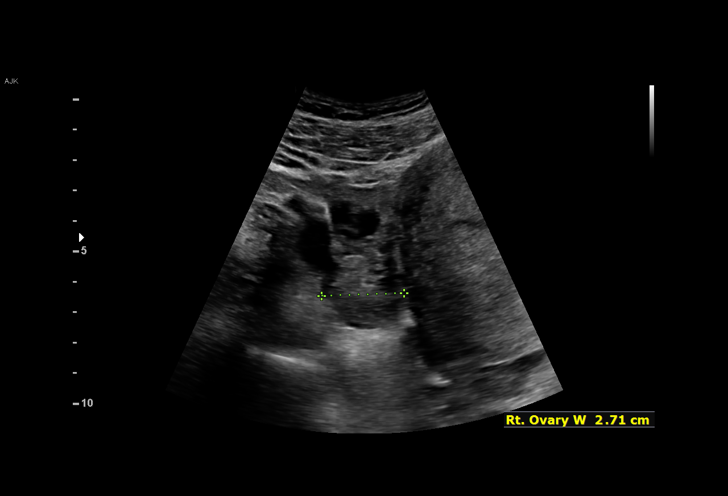
[im 35/56]
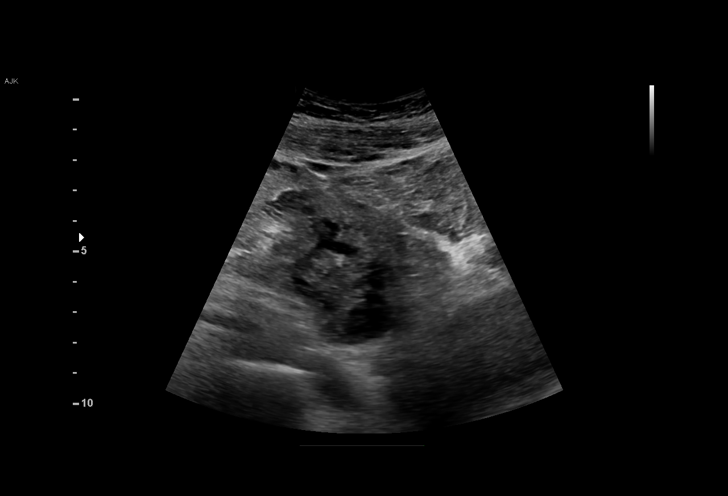
[im 39/56]
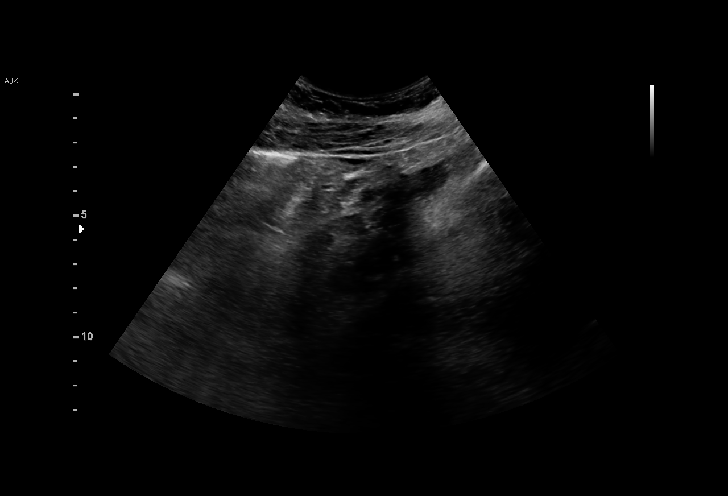
[im 44/56]
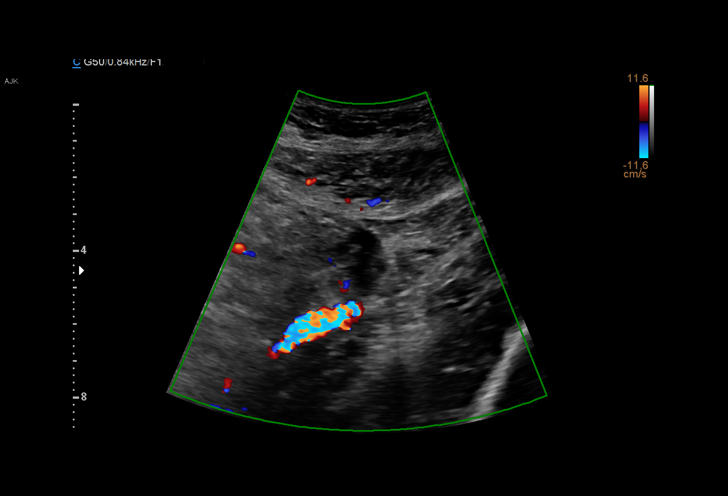
[im 46/56]
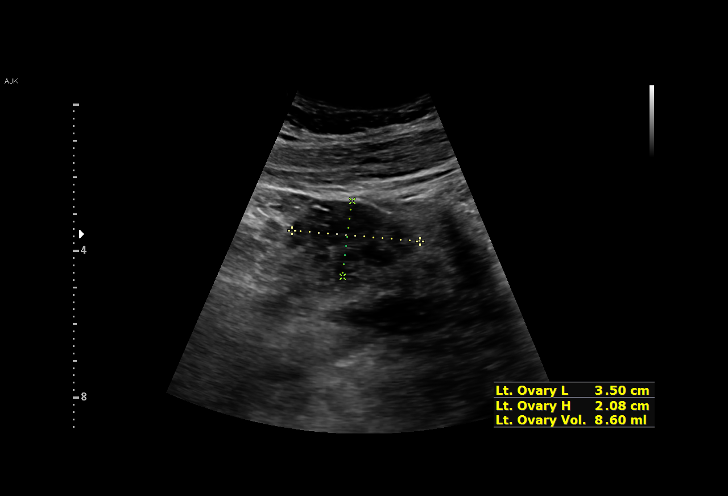
[im 51/56]
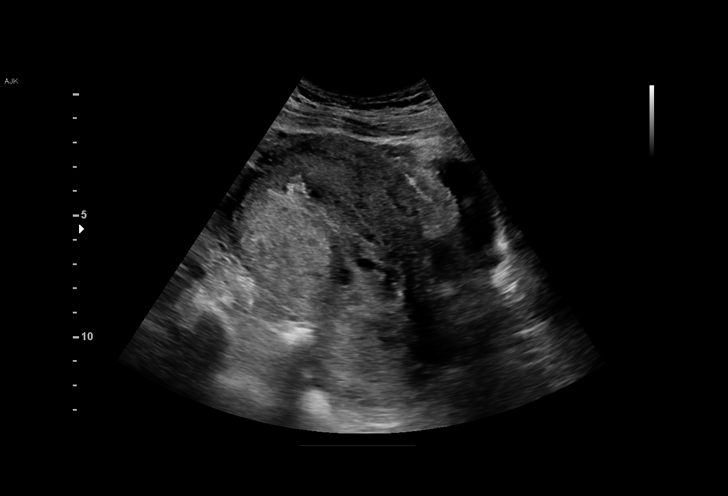
[im 56/56]
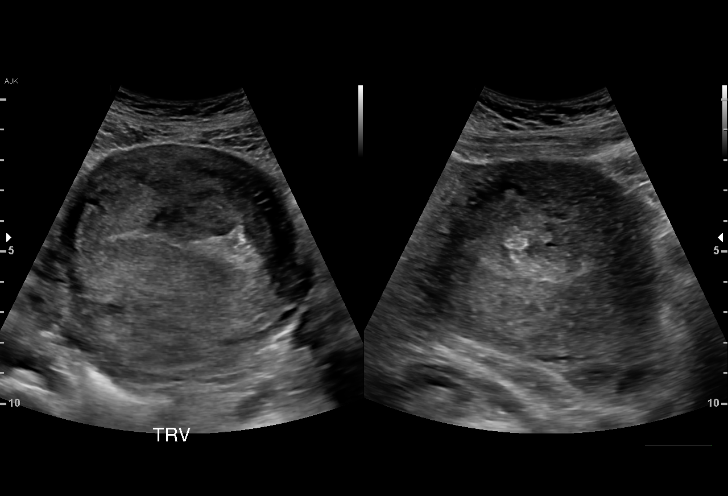

[15 of 25 positions shown; findings below may reference images not displayed]

FINDINGS: Uterus

Measurements: 12.5 x 8.3 x 7.3 cm = volume: 395.4 mL. No fibroids or
other mass visualized.

Endometrium

Thickness: 13.2 mm. In the fundal portion of the endometrial canal
on the left side there is some heterogeneously echogenic material
which demonstrates some internal Doppler flow, concerning for
potential retained products of conception.

Right ovary

Measurements: 3.7 x 2.9 x 2.7 cm = volume: 14.9 mL. Normal
appearance/no adnexal mass.

Left ovary

Measurements: 3.5 x 2.1 x 2.3 cm = volume: 8.6 mL. Normal
appearance/no adnexal mass.

Pulsed Doppler evaluation demonstrates normal low-resistance
arterial and venous waveforms in both ovaries.

Other: None.
IMPRESSION: 1. Focal thickening of the endometrium in the left side of the
fundal region which is heterogeneously echogenic in appearance with
some internal blood flow, concerning for potential retained products
of conception.

## 2023-07-25 ENCOUNTER — Ambulatory Visit: Admitting: General Practice

## 2023-07-30 ENCOUNTER — Ambulatory Visit: Admitting: General Practice

## 2023-09-24 DIAGNOSIS — B009 Herpesviral infection, unspecified: Secondary | ICD-10-CM | POA: Insufficient documentation

## 2023-09-25 ENCOUNTER — Encounter: Payer: Self-pay | Admitting: General Practice

## 2023-09-25 ENCOUNTER — Ambulatory Visit: Admitting: General Practice

## 2023-09-25 VITALS — BP 122/82 | HR 57 | Temp 99.1°F | Ht 66.0 in | Wt 185.0 lb

## 2023-09-25 DIAGNOSIS — Z7689 Persons encountering health services in other specified circumstances: Secondary | ICD-10-CM | POA: Insufficient documentation

## 2023-09-25 DIAGNOSIS — J3089 Other allergic rhinitis: Secondary | ICD-10-CM

## 2023-09-25 MED ORDER — FLUTICASONE PROPIONATE 50 MCG/ACT NA SUSP: 2.0000 | Freq: Every day | NASAL | 2 refills | Status: AC

## 2023-09-25 MED ORDER — CETIRIZINE HCL 10 MG PO TABS: 10.0000 mg | ORAL_TABLET | Freq: Every day | ORAL | 2 refills | Status: AC

## 2023-09-25 NOTE — Progress Notes (Signed)
 New Patient Office Visit  Subjective    Patient ID: Samantha Trujillo, female    DOB: 23-Oct-1980  Age: 43 y.o. MRN: [id number]  CC:  Chief Complaint  Patient presents with   New Patient (Initial Visit)   Allergies    Wants to discuss seasonal allergies    HPI Samantha Trujillo is a 43 y.o. female presents to establish care.   Previous PCP/physical/labs: has been going to the GYN for the past few years.  Discussed the use of AI scribe software for clinical note transcription with the patient, who gave verbal consent to proceed.  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 Working from home has helped manage her symptoms, but she anticipates returning to the office in February and is concerned about potential exposure to allergens, including pets brought by coworkers. She uses a humidifier at home.  She had high blood pressure postpartum, related to preeclampsia, and required antihypertensive medication for six weeks. Her blood pressure has since normalized, and she is not currently on any antihypertensive medications. No current symptoms of hypertension.  She is currently breastfeeding her 81-month-old child but plans to wean soon. She has a history of miscarriages and has had a dilation and curettage procedure in the past.  Her family history includes various cancers, with her maternal grandfather having had throat cancer and her paternal grandfather having had lung cancer, both of whom were smokers. Her daughter has type 1 diabetes, diagnosed after COVID-19.  She is concerned about her weight and has been trying to lose weight, noting difficulty despite efforts. She has not had her thyroid levels checked recently and is interested in having this evaluated.    Outpatient Encounter Medications as of 09/25/2023  Medication Sig    cetirizine  (ZYRTEC ) 10 MG tablet Take 1 tablet (10 mg total) by mouth daily.   fluticasone  (FLONASE ) 50 MCG/ACT nasal spray Place 2 sprays into both nostrils daily.   Prenatal Vit-Fe Fumarate-FA (PRENATAL MULTIVITAMIN) TABS tablet Take 1 tablet by mouth daily at 12 noon.   [DISCONTINUED] docusate sodium  (COLACE) 100 MG capsule Take 1 capsule (100 mg total) by mouth 2 (two) times daily.   [DISCONTINUED] ibuprofen  (ADVIL ) 600 MG tablet Take 1 tablet (600 mg total) by mouth every 6 (six) hours as needed.   [DISCONTINUED] NIFEdipine  (PROCARDIA -XL/NIFEDICAL-XL) 30 MG 24 hr tablet Take 1 tablet (30 mg total) by mouth daily.   [DISCONTINUED] oxyCODONE  (OXY IR/ROXICODONE ) 5 MG immediate release tablet Take 1 tablet (5 mg total) by mouth every 4 (four) hours as needed for severe pain.   No facility-administered encounter medications on file as of 09/25/2023.    Past Medical History:  Diagnosis Date   Medical history non-contributory    Vaginal Pap smear, abnormal    Varicose veins     Past Surgical History:  Procedure Laterality Date   CESAREAN SECTION WITH BILATERAL TUBAL LIGATION Bilateral 02/19/2022   Procedure: PRIMARY CESAREAN SECTION WITH BILATERAL TUBAL LIGATION;  Surgeon: Dannielle Bouchard, DO;  Location: [facility];  Service: Obstetrics;  Laterality: Bilateral;   DILATION AND CURETTAGE OF UTERUS N/A 11/23/2020   Procedure: DILATATION AND CURETTAGE;  Surgeon: Curlene Agent, MD;  Location: [facility];  Service: Gynecology;  Laterality: N/A;   NO PAST SURGERIES     OPERATIVE ULTRASOUND N/A 11/23/2020   Procedure: OPERATIVE ULTRASOUND;  Surgeon: Curlene Agent, MD;  Location: [facility];  Service: Gynecology;  Laterality: N/A;    History reviewed. No pertinent family history.  Social History   Socioeconomic History   Marital status: Married    Spouse name: Not on file   Number of children: Not on file   Years of education: Not on file   Highest education level: Not on file  Occupational History    Not on file  Tobacco Use   Smoking status: Never   Smokeless tobacco: Never  Vaping Use   Vaping status: Never Used  Substance and Sexual Activity   Alcohol use: No   Drug use: No   Sexual activity: Yes  Other Topics Concern   Not on file  Social History Narrative   Not on file   Social Drivers of Health   Financial Resource Strain: Not on file  Food Insecurity: No Food Insecurity (02/19/2022)   Hunger Vital Sign    Worried About Running Out of Food in the Last Year: Never true    Ran Out of Food in the Last Year: Never true  Transportation Needs: No Transportation Needs (02/19/2022)   PRAPARE - Administrator, Civil Service (Medical): No    Lack of Transportation (Non-Medical): No  Physical Activity: Not on file  Stress: Not on file  Social Connections: Not on file  Intimate Partner Violence: Not At Risk (02/19/2022)   Humiliation, Afraid, Rape, and Kick questionnaire    Fear of Current or Ex-Partner: No    Emotionally Abused: No    Physically Abused: No    Sexually Abused: No    Review of Systems  Constitutional:  Negative for chills and fever.  HENT:  Negative for congestion, ear discharge, ear pain, nosebleeds, sinus pain, sore throat and tinnitus.        Allergies  Respiratory:  Negative for shortness of breath and stridor.   Cardiovascular:  Negative for chest pain.  Gastrointestinal:  Negative for abdominal pain, constipation, diarrhea, heartburn, nausea and vomiting.  Genitourinary:  Negative for dysuria, frequency and urgency.  Neurological:  Negative for dizziness and headaches.  Endo/Heme/Allergies:  Negative for polydipsia.  Psychiatric/Behavioral:  Negative for depression and suicidal ideas. The patient is not nervous/anxious.         Objective    BP 122/82   Pulse (!) 57   Temp 99.1 F (37.3 C) (Oral)   Ht 5' 6 (1.676 m)   Wt 185 lb (83.9 kg)   LMP 09/05/2023   SpO2 99%   Breastfeeding Yes   BMI 29.86 kg/m   Physical  Exam Vitals and nursing note reviewed.  Constitutional:      Appearance: Normal appearance.  HENT:     Right Ear: Tympanic membrane, ear canal and external ear normal.     Left Ear: Tympanic membrane, ear canal and external ear normal.     Nose: Nose normal.     Mouth/Throat:     Mouth: Mucous membranes are moist.     Pharynx: Oropharynx is clear.  Eyes:     Conjunctiva/sclera: Conjunctivae normal.  Cardiovascular:     Rate and Rhythm: Normal rate and regular rhythm.     Pulses: Normal pulses.     Heart sounds: Normal heart sounds.  Pulmonary:     Effort: Pulmonary effort is normal.     Breath sounds: Normal breath sounds.  Neurological:     Mental Status: She is alert and oriented to person, place, and time.  Psychiatric:        Mood  and Affect: Mood normal.        Behavior: Behavior normal.        Thought Content: Thought content normal.        Judgment: Judgment normal.          Assessment & Plan:  Environmental and seasonal allergies -     Fluticasone  Propionate; Place 2 sprays into both nostrils daily.  Dispense: 16 g; Refill: 2 -     Cetirizine  HCl; Take 1 tablet (10 mg total) by mouth daily.  Dispense: 30 tablet; Refill: 2  Establishing care with new doctor, encounter for    Assessment and Plan Assessment & Plan Allergic rhinitis Allergic rhinitis with congestion, rhinorrhea, and sinus infections, exacerbated by environmental factors and potentially seasonal changes. 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 - Initiate daily Zyrtec  at bedtime, safe for breastfeeding. - Consider Claritin if Zyrtec  causes somnolence. - Avoid DM formulations of Claritin and Allegra due to potential hypertension. - Administer Flonase  once daily - Continue using a cool mist humidifier at home.  Overweight Overweight with difficulty losing weight, potentially influenced by age and postpartum changes. No recent evaluation for thyroid or diabetes. -  Schedule physical exam and fasting labs in four weeks to evaluate cholesterol, diabetes, and thyroid function. - Discuss weight management strategies after lab results are available.   Return in about 4 weeks (around 10/23/2023) for physical and fasting labs.SABRA Carrol [place], NP

## 2023-09-25 NOTE — Patient Instructions (Signed)
 Nasal Congestion/Ear Pressure/Sinus Pressure: Try using Flonase  (fluticasone ) nasal spray. Instill 1 spray in each nostril twice daily. This can be purchased over the counter. Runny Nose/Throat Drainage/Sneezing/Itchy or Watery Eyes: An antihistamine such as Zyrtec , Claritin, Xyzal, Allegra  Follow up in 4 weeks for physical and labs.   It was a pleasure to meet you today! Please don't hesitate to contact me with any questions. Welcome to [facility]!

## 2023-10-23 ENCOUNTER — Encounter: Admitting: General Practice

## 2023-11-06 ENCOUNTER — Encounter: Payer: Self-pay | Admitting: General Practice

## 2023-11-06 ENCOUNTER — Ambulatory Visit: Payer: Self-pay | Admitting: General Practice

## 2023-11-06 ENCOUNTER — Ambulatory Visit (INDEPENDENT_AMBULATORY_CARE_PROVIDER_SITE_OTHER): Admitting: General Practice

## 2023-11-06 VITALS — BP 118/84 | HR 71 | Temp 97.6°F | Ht 66.0 in | Wt 185.0 lb

## 2023-11-06 DIAGNOSIS — Z0000 Encounter for general adult medical examination without abnormal findings: Secondary | ICD-10-CM

## 2023-11-06 DIAGNOSIS — B009 Herpesviral infection, unspecified: Secondary | ICD-10-CM

## 2023-11-06 DIAGNOSIS — J3089 Other allergic rhinitis: Secondary | ICD-10-CM | POA: Diagnosis not present

## 2023-11-06 DIAGNOSIS — D500 Iron deficiency anemia secondary to blood loss (chronic): Secondary | ICD-10-CM

## 2023-11-06 LAB — COMPREHENSIVE METABOLIC PANEL WITH GFR
ALT: 8 U/L (ref 0–35)
AST: 11 U/L (ref 0–37)
Albumin: 3.9 g/dL (ref 3.5–5.2)
Alkaline Phosphatase: 44 U/L (ref 39–117)
BUN: 13 mg/dL (ref 6–23)
CO2: 31 meq/L (ref 19–32)
Calcium: 8.9 mg/dL (ref 8.4–10.5)
Chloride: 103 meq/L (ref 96–112)
Creatinine, Ser: 0.78 mg/dL (ref 0.40–1.20)
GFR: 94.67 mL/min (ref >60.00)
Glucose, Bld: 88 mg/dL (ref 70–99)
Potassium: 4.8 meq/L (ref 3.5–5.1)
Sodium: 140 meq/L (ref 135–145)
Total Bilirubin: 0.5 mg/dL (ref 0.2–1.2)
Total Protein: 6.2 g/dL (ref 6.0–8.3)

## 2023-11-06 LAB — IBC + FERRITIN
Ferritin: 4.4 ng/mL — ABNORMAL LOW (ref 10.0–291.0)
Iron: 54 ug/dL (ref 42–145)
Saturation Ratios: 11 % — ABNORMAL LOW (ref 20.0–50.0)
TIBC: 491.4 ug/dL — ABNORMAL HIGH (ref 250.0–450.0)
Transferrin: 351 mg/dL (ref 212.0–360.0)

## 2023-11-06 LAB — LIPID PANEL
Cholesterol: 192 mg/dL (ref 0–200)
HDL: 59.9 mg/dL (ref >39.00)
LDL Cholesterol: 113 mg/dL — ABNORMAL HIGH (ref 0–99)
NonHDL: 132.11
Total CHOL/HDL Ratio: 3
Triglycerides: 97 mg/dL (ref 0.0–149.0)
VLDL: 19.4 mg/dL (ref 0.0–40.0)

## 2023-11-06 LAB — CBC
HCT: 35.9 % — ABNORMAL LOW (ref 36.0–46.0)
Hemoglobin: 11.6 g/dL — ABNORMAL LOW (ref 12.0–15.0)
MCHC: 32.4 g/dL (ref 30.0–36.0)
MCV: 82.3 fl (ref 78.0–100.0)
Platelets: 350 K/uL (ref 150.0–400.0)
RBC: 4.36 Mil/uL (ref 3.87–5.11)
RDW: 12.8 % (ref 11.5–15.5)
WBC: 5.6 K/uL (ref 4.0–10.5)

## 2023-11-06 LAB — TSH: TSH: 0.87 u[IU]/mL (ref 0.35–5.50)

## 2023-11-06 LAB — HEMOGLOBIN A1C: Hgb A1c MFr Bld: 5.9 % (ref 4.6–6.5)

## 2023-11-06 NOTE — Assessment & Plan Note (Signed)
 Immunizations tdap- UTD; declines influenza Pap smear UTD. Mammogram due- will complete at GYN.  Discussed the importance of a healthy diet and regular exercise in order for weight loss, and to reduce the risk of further co-morbidity.  Exam stable. Labs pending.  Follow up in 1 year for repeat physical.

## 2023-11-06 NOTE — Assessment & Plan Note (Signed)
 Controlled.  Continue Flonase  and zyrtec  as needed.

## 2023-11-06 NOTE — Assessment & Plan Note (Signed)
 Cbc and iron studies pending.

## 2023-11-06 NOTE — Progress Notes (Signed)
 Established Patient Office Visit  Subjective   Patient ID: Samantha Trujillo, female    DOB: [birth year]  Age: 43 y.o. MRN: [id number]  Chief Complaint  Patient presents with   Annual Exam    With fasting labs    HPI  Corinne Goucher is a 43 year old female with past medical history of herpes simplex, seasonal allergies presents today for for complete physical and follow up of chronic conditions.  Immunizations: -Tetanus: Completed in 2023 -Influenza: declines - HPV: completed when younger.  Diet: Fair diet.  Exercise: No regular exercise.  Eye exam: never completed.   Dental exam: Completes semi-annually    Pap Smear: Completed in 2023 Mammogram: due; will complete at the GYN.    Patient Active Problem List   Diagnosis Date Noted   Encounter for screening and preventative care 11/06/2023   Iron deficiency anemia due to chronic blood loss 11/06/2023   Environmental and seasonal allergies 09/25/2023   Herpes simplex 09/24/2023   Past Medical History:  Diagnosis Date   Medical history non-contributory    Migraines    Vaginal Pap smear, abnormal    Varicose veins    Past Surgical History:  Procedure Laterality Date   CESAREAN SECTION WITH BILATERAL TUBAL LIGATION Bilateral 02/19/2022   Procedure: PRIMARY CESAREAN SECTION WITH BILATERAL TUBAL LIGATION;  Surgeon: Dannielle Bouchard, DO;  Location: [facility];  Service: Obstetrics;  Laterality: Bilateral;   DILATION AND CURETTAGE OF UTERUS N/A 11/23/2020   Procedure: DILATATION AND CURETTAGE;  Surgeon: Curlene Agent, MD;  Location: [facility] OR;  Service: Gynecology;  Laterality: N/A;   NO PAST SURGERIES     OPERATIVE ULTRASOUND N/A 11/23/2020   Procedure: OPERATIVE ULTRASOUND;  Surgeon: Curlene Agent, MD;  Location: [facility] OR;  Service: Gynecology;  Laterality: N/A;   Allergies  Allergen Reactions   Augmentin [Amoxicillin-Pot Clavulanate] Other (See Comments)    dizzy   Covid-19 Mrna Vacc (Moderna)     Patient is allergic  to ALL COVID 19 vaccines   Codeine Rash   Zithromax [Azithromycin] Rash         11/06/2023    8:15 AM 09/25/2023   10:01 AM  Depression screen PHQ 2/9  Decreased Interest 0 0  Down, Depressed, Hopeless 0 0  PHQ - 2 Score 0 0  Altered sleeping 0 0  Tired, decreased energy 0 0  Change in appetite 0 0  Feeling bad or failure about yourself  0 0  Trouble concentrating 0 0  Moving slowly or fidgety/restless 0 0  Suicidal thoughts 0 0  PHQ-9 Score 0 0  Difficult doing work/chores Not difficult at all Not difficult at all       11/06/2023    8:15 AM 09/25/2023   10:01 AM  GAD 7 : Generalized Anxiety Score  Nervous, Anxious, on Edge 0 0  Control/stop worrying 0 0  Worry too much - different things 0 0  Trouble relaxing 0 0  Restless 0 0  Easily annoyed or irritable 0 0  Afraid - awful might happen 0 0  Total GAD 7 Score 0 0  Anxiety Difficulty Not difficult at all Not difficult at all      Review of Systems  Constitutional:  Negative for chills, fever, malaise/fatigue and weight loss.  HENT:  Negative for congestion, ear discharge, ear pain, hearing loss, nosebleeds, sinus pain, sore throat and tinnitus.   Eyes:  Negative for blurred vision, double vision, pain, discharge and redness.  Respiratory:  Negative for cough,  shortness of breath, wheezing and stridor.   Cardiovascular:  Negative for chest pain, palpitations and leg swelling.  Gastrointestinal:  Negative for abdominal pain, constipation, diarrhea, heartburn, nausea and vomiting.  Genitourinary:  Negative for dysuria, frequency and urgency.  Musculoskeletal:  Negative for myalgias.  Skin:  Negative for rash.  Neurological:  Negative for dizziness, tingling, seizures, weakness and headaches.  Psychiatric/Behavioral:  Negative for depression, substance abuse and suicidal ideas. The patient is not nervous/anxious.       Objective:     BP 118/84   Pulse 71   Temp 97.6 F (36.4 C) (Oral)   Ht 5' 6 (1.676 m)    Wt 185 lb (83.9 kg)   LMP 10/25/2023 (Exact Date)   SpO2 97%   Breastfeeding No   BMI 29.86 kg/m  BP Readings from Last 3 Encounters:  11/06/23 118/84  09/25/23 122/82  02/24/22 (!) 144/84   Wt Readings from Last 3 Encounters:  11/06/23 185 lb (83.9 kg)  09/25/23 185 lb (83.9 kg)  02/24/22 219 lb 12.8 oz (99.7 kg)      Physical Exam Vitals and nursing note reviewed.  Constitutional:      Appearance: Normal appearance.  HENT:     Head: Normocephalic and atraumatic.     Right Ear: Tympanic membrane, ear canal and external ear normal.     Left Ear: Tympanic membrane, ear canal and external ear normal.     Nose: Nose normal.     Mouth/Throat:     Mouth: Mucous membranes are moist.     Pharynx: Oropharynx is clear.  Eyes:     Conjunctiva/sclera: Conjunctivae normal.     Pupils: Pupils are equal, round, and reactive to light.  Cardiovascular:     Rate and Rhythm: Normal rate and regular rhythm.     Pulses: Normal pulses.     Heart sounds: Normal heart sounds.  Pulmonary:     Effort: Pulmonary effort is normal.     Breath sounds: Normal breath sounds.  Abdominal:     General: Abdomen is flat. Bowel sounds are normal.     Palpations: Abdomen is soft.  Musculoskeletal:        General: Normal range of motion.     Cervical back: Normal range of motion.  Skin:    General: Skin is warm and dry.     Capillary Refill: Capillary refill takes less than 2 seconds.  Neurological:     General: No focal deficit present.     Mental Status: She is alert and oriented to person, place, and time. Mental status is at baseline.  Psychiatric:        Mood and Affect: Mood normal.        Behavior: Behavior normal.        Thought Content: Thought content normal.        Judgment: Judgment normal.      No results found for any visits on 11/06/23.     The ASCVD Risk score (Arnett DK, et al., 2019) failed to calculate for the following reasons:   Cannot find a previous HDL lab   Cannot  find a previous total cholesterol lab    Assessment & Plan:  Encounter for screening and preventative care Assessment & Plan: Immunizations tdap- UTD; declines influenza Pap smear UTD. Mammogram due- will complete at GYN.  Discussed the importance of a healthy diet and regular exercise in order for weight loss, and to reduce the risk of further co-morbidity.  Exam stable.  Labs pending.  Follow up in 1 year for repeat physical.   Orders: -     CBC -     Comprehensive metabolic panel with GFR -     Lipid panel -     TSH -     Hemoglobin A1c  Iron deficiency anemia due to chronic blood loss Assessment & Plan: Cbc and iron studies pending.  Orders: -     IBC + Ferritin  Environmental and seasonal allergies Assessment & Plan: Controlled.  Continue Flonase  and zyrtec  as needed.   Herpes simplex Assessment & Plan: No recent flare ups.     Return in about 1 year (around 11/05/2024) for physical and  fasting labs.SABRA Carrol Aurora, NP

## 2023-11-06 NOTE — Patient Instructions (Signed)
 Stop by the lab prior to leaving today. I will notify you of your results once received.   Schedule physical for next year.   It was a pleasure to see you today!

## 2023-11-06 NOTE — Assessment & Plan Note (Signed)
No recent flare ups.   

## 2023-11-14 DIAGNOSIS — Z01419 Encounter for gynecological examination (general) (routine) without abnormal findings: Secondary | ICD-10-CM | POA: Diagnosis not present

## 2023-11-14 DIAGNOSIS — Z124 Encounter for screening for malignant neoplasm of cervix: Secondary | ICD-10-CM | POA: Diagnosis not present

## 2023-11-14 DIAGNOSIS — Z1151 Encounter for screening for human papillomavirus (HPV): Secondary | ICD-10-CM | POA: Diagnosis not present

## 2023-11-26 ENCOUNTER — Telehealth: Payer: Self-pay | Admitting: Pharmacy Technician

## 2023-11-26 ENCOUNTER — Inpatient Hospital Stay: Attending: Hematology | Admitting: Hematology

## 2023-11-26 ENCOUNTER — Encounter: Payer: Self-pay | Admitting: Hematology

## 2023-11-26 ENCOUNTER — Inpatient Hospital Stay

## 2023-11-26 VITALS — BP 114/78 | HR 78 | Temp 98.0°F | Resp 17 | Ht 66.0 in | Wt 189.9 lb

## 2023-11-26 DIAGNOSIS — K5900 Constipation, unspecified: Secondary | ICD-10-CM | POA: Diagnosis not present

## 2023-11-26 DIAGNOSIS — D509 Iron deficiency anemia, unspecified: Secondary | ICD-10-CM | POA: Insufficient documentation

## 2023-11-26 DIAGNOSIS — Z801 Family history of malignant neoplasm of trachea, bronchus and lung: Secondary | ICD-10-CM | POA: Insufficient documentation

## 2023-11-26 DIAGNOSIS — R5383 Other fatigue: Secondary | ICD-10-CM | POA: Diagnosis not present

## 2023-11-26 DIAGNOSIS — N920 Excessive and frequent menstruation with regular cycle: Secondary | ICD-10-CM | POA: Diagnosis not present

## 2023-11-26 DIAGNOSIS — D500 Iron deficiency anemia secondary to blood loss (chronic): Secondary | ICD-10-CM

## 2023-11-26 NOTE — Telephone Encounter (Signed)
 Auth Submission: NO AUTH NEEDED Site of care: Site of care: [facility] Payer: BCBS Medication & CPT/J Code(s) submitted: Venofer (Iron Sucrose) J1756 Diagnosis Code: D50.9 Route of submission (phone, fax, portal):  Phone # Fax # Auth type: Buy/Bill PB Units/visits requested: 5 DOSES Reference number:  Approval from: 11/26/23 to 01/29/24

## 2023-11-26 NOTE — Progress Notes (Signed)
 [facility]   [phone] [phone]   Clinic New Consult Note   Patient Care Team: Vincente Shivers, NP as PCP - General (General Practice) Dannielle Bouchard, DO as Consulting Physician (Obstetrics and Gynecology) 11/26/2023  CHIEF COMPLAINTS/PURPOSE OF CONSULTATION:  Iron deficient anemia  REFERRING PHYSICIAN: Vincente Shivers, NP   Discussed the use of AI scribe software for clinical note transcription with the patient, who gave verbal consent to proceed.  History of Present Illness Samantha Trujillo is a 43 year old female who presents for a new consult regarding iron deficient anemia, she was referred by her new primary care doctor for iron deficiency anemia management.  She has iron deficiency anemia, initially identified during pregnancy in 2017, managed with oral iron supplements. The anemia resolved post-pregnancy but has recurred recently, with a current ferritin level of 4.4 ng/mL.  She experiences heavy menstrual bleeding since her tubal ligation and childbirth, with cycles occurring twice a month, each lasting four to five days with heavy flow. She was prescribed medication for clotting and heavy bleeding but is not currently taking it.  She experiences fatigue and feeling cold. She has tried oral iron supplements in the past but discontinued them due to constipation. Currently, she is taking prenatal vitamins as a source of iron until she can begin iron infusions.     MEDICAL HISTORY:  Past Medical History:  Diagnosis Date   Medical history non-contributory    Migraines    Vaginal Pap smear, abnormal    Varicose veins     SURGICAL HISTORY: Past Surgical History:  Procedure Laterality Date   CESAREAN SECTION WITH BILATERAL TUBAL LIGATION Bilateral 02/19/2022   Procedure: PRIMARY CESAREAN SECTION WITH BILATERAL TUBAL LIGATION;  Surgeon: Dannielle Bouchard, DO;  Location: [facility];  Service: Obstetrics;  Laterality: Bilateral;   DILATION  AND CURETTAGE OF UTERUS N/A 11/23/2020   Procedure: DILATATION AND CURETTAGE;  Surgeon: Curlene Agent, MD;  Location: [facility] OR;  Service: Gynecology;  Laterality: N/A;   NO PAST SURGERIES     OPERATIVE ULTRASOUND N/A 11/23/2020   Procedure: OPERATIVE ULTRASOUND;  Surgeon: Curlene Agent, MD;  Location: [facility] OR;  Service: Gynecology;  Laterality: N/A;    SOCIAL HISTORY: Social History   Socioeconomic History   Marital status: Married    Spouse name: Not on file   Number of children: 4   Years of education: Not on file   Highest education level: Not on file  Occupational History   Not on file  Tobacco Use   Smoking status: Never   Smokeless tobacco: Never  Vaping Use   Vaping status: Never Used  Substance and Sexual Activity   Alcohol use: No   Drug use: No   Sexual activity: Yes  Other Topics Concern   Not on file  Social History Narrative   Not on file   Social Drivers of Health   Financial Resource Strain: Not on file  Food Insecurity: No Food Insecurity (11/26/2023)   Hunger Vital Sign    Worried About Running Out of Food in the Last Year: Never true    Ran Out of Food in the Last Year: Never true  Transportation Needs: No Transportation Needs (11/26/2023)   PRAPARE - Administrator, Civil Service (Medical): No    Lack of Transportation (Non-Medical): No  Physical Activity: Not on file  Stress: Not on file  Social Connections: Not on file  Intimate Partner Violence: Not At Risk (11/26/2023)   Humiliation, Afraid,  Rape, and Kick questionnaire    Fear of Current or Ex-Partner: No    Emotionally Abused: No    Physically Abused: No    Sexually Abused: No    FAMILY HISTORY: Family History  Problem Relation Age of Onset   Hyperlipidemia Mother    Hypertension Father    Heart disease Father    Diabetes Father    Alcohol abuse Father    Cancer Maternal Grandmother        throat cancer and gastric cancer   Cancer Maternal Grandfather        throat  cancer   Cancer Paternal Grandfather        lung cancer   Diabetes Daughter     ALLERGIES:  is allergic to augmentin [amoxicillin-pot clavulanate], covid-19 mrna vacc (moderna), codeine, and zithromax [azithromycin].  MEDICATIONS:  Current Outpatient Medications  Medication Sig Dispense Refill   cetirizine  (ZYRTEC ) 10 MG tablet Take 1 tablet (10 mg total) by mouth daily. 30 tablet 2   fluticasone  (FLONASE ) 50 MCG/ACT nasal spray Place 2 sprays into both nostrils daily. 16 g 2   Prenatal Vit-Fe Fumarate-FA (PRENATAL MULTIVITAMIN) TABS tablet Take 1 tablet by mouth daily at 12 noon.     No current facility-administered medications for this visit.    REVIEW OF SYSTEMS:   Constitutional: Denies fevers, chills or abnormal night sweats Eyes: Denies blurriness of vision, double vision or watery eyes Ears, nose, mouth, throat, and face: Denies mucositis or sore throat Respiratory: Denies cough, dyspnea or wheezes Cardiovascular: Denies palpitation, chest discomfort or lower extremity swelling Gastrointestinal:  Denies nausea, heartburn or change in bowel habits Skin: Denies abnormal skin rashes Lymphatics: Denies new lymphadenopathy or easy bruising Neurological:Denies numbness, tingling or new weaknesses Behavioral/Psych: Mood is stable, no new changes  All other systems were reviewed with the patient and are negative.  PHYSICAL EXAMINATION: ECOG PERFORMANCE STATUS: 0 - Asymptomatic  Vitals:   11/26/23 1456  BP: 114/78  Pulse: 78  Resp: 17  Temp: 98 F (36.7 C)  SpO2: 97%   Filed Weights   11/26/23 1456  Weight: 189 lb 14.4 oz (86.1 kg)    GENERAL:alert, no distress and comfortable SKIN: skin color, texture, turgor are normal, no rashes or significant lesions EYES: normal, conjunctiva are pink and non-injected, sclera clear OROPHARYNX:no exudate, no erythema and lips, buccal mucosa, and tongue normal  NECK: supple, thyroid normal size, non-tender, without  nodularity LYMPH:  no palpable lymphadenopathy in the cervical, axillary or inguinal LUNGS: clear to auscultation and percussion with normal breathing effort HEART: regular rate & rhythm and no murmurs and no lower extremity edema ABDOMEN:abdomen soft, non-tender and normal bowel sounds Musculoskeletal:no cyanosis of digits and no clubbing  PSYCH: alert & oriented x 3 with fluent speech NEURO: no focal motor/sensory deficits  Physical Exam CHEST: Lungs normal ABDOMEN: Abdomen normal, liver normal, spleen normal  LABORATORY DATA:  I have reviewed the data as listed    Latest Ref Rng & Units 11/06/2023    8:37 AM 02/24/2022    9:50 AM 02/20/2022    5:14 AM  CBC  WBC 4.0 - 10.5 K/uL 5.6  8.4  11.4   Hemoglobin 12.0 - 15.0 g/dL 88.3  89.0  89.5   Hematocrit 36.0 - 46.0 % 35.9  32.7  32.2   Platelets 150.0 - 400.0 K/uL 350.0  348  241       Latest Ref Rng & Units 11/06/2023    8:37 AM 02/24/2022    9:50  AM  CMP  Glucose 70 - 99 mg/dL 88  94   BUN 6 - 23 mg/dL 13  6   Creatinine 9.59 - 1.20 mg/dL 9.21  9.29   Sodium 864 - 145 mEq/L 140  139   Potassium 3.5 - 5.1 mEq/L 4.8  4.2   Chloride 96 - 112 mEq/L 103  107   CO2 19 - 32 mEq/L 31  26   Calcium 8.4 - 10.5 mg/dL 8.9  8.5   Total Protein 6.0 - 8.3 g/dL 6.2  5.4   Total Bilirubin 0.2 - 1.2 mg/dL 0.5  0.6   Alkaline Phos 39 - 117 U/L 44  98   AST 0 - 37 U/L 11  20   ALT 0 - 35 U/L 8  22      RADIOGRAPHIC STUDIES: I have personally reviewed the radiological images as listed and agreed with the findings in the report. No results found.   Assessment & Plan Iron deficiency anemia secondary to menorrhagia Chronic iron deficiency anemia due to heavy menstrual bleeding.  Lab on November 06, 2023 showed hemoglobin level 11.6 g/dL, slightly below normal range (12-15 g/dL). Ferritin level is 4.4 ng/mL, indicating low iron stores. She experiences fatigue and cold intolerance but is able to function in daily activities. Oral iron  supplements cause constipation so she does not tolerate, and dietary adjustments have been made. IV iron infusion is considered due to intolerance to oral iron and significant menorrhagia. IV iron infusion, discussing potential allergic reactions and the need for premedication with Benadryl  or Claritin. - Request insurance approval for IV iron infusion (Venofer) at 200 mg per sessionX5 - Schedule IV iron infusions at [facility] facility on [facility], once or twice a week for five sessions. - Administer Benadryl  or Claritin prior to infusion to prevent allergic reactions. - Repeat lab work six weeks after completion of infusions to assess hemoglobin and ferritin levels. - Monitor blood counts every three months post-infusion.  Menorrhagia (heavy menstrual bleeding) Menorrhagia with menstrual cycles occurring twice a month, lasting 4-5 days with heavy flow. She uses both tampons and pads, changing them hourly. Menorrhagia is the primary cause of iron deficiency anemia. She is considering options to manage heavy bleeding, including potential surgical interventions like hysterectomy, but is concerned about side effects. Alternatives such as ablation or IUD were discussed as non-surgical options. - Discuss with gynecologist about options to manage menorrhagia, including ablation or IUD. - Consider non-surgical options to manage heavy menstrual bleeding.  Plan - Reason lab reviewed, which is consistent with iron deficiency, likely related to her menorrhagia - Due to poor tolerance to oral iron, I recommend IV iron Venofer 200 mg x 5 in next month - Will repeat lab in 6 weeks and follow-up in 3 months.   No orders of the defined types were placed in this encounter.   All questions were answered. The patient knows to call the clinic with any problems, questions or concerns. I spent 25 minutes counseling the patient face to face. The total time spent in the appointment was 30 minutes including  review of chart and various tests results, discussions about plan of care and coordination of care plan.     Onita Mattock, MD 11/26/2023 3:52 PM

## 2023-11-29 ENCOUNTER — Other Ambulatory Visit: Payer: Self-pay

## 2023-12-03 ENCOUNTER — Ambulatory Visit (INDEPENDENT_AMBULATORY_CARE_PROVIDER_SITE_OTHER)

## 2023-12-03 VITALS — BP 117/70 | HR 68 | Temp 98.2°F | Resp 16 | Ht 66.0 in | Wt 189.2 lb

## 2023-12-03 DIAGNOSIS — D500 Iron deficiency anemia secondary to blood loss (chronic): Secondary | ICD-10-CM

## 2023-12-03 MED ORDER — ACETAMINOPHEN 325 MG PO TABS
650.0000 mg | ORAL_TABLET | Freq: Once | ORAL | Status: DC
  Filled 2023-12-03: qty 2

## 2023-12-03 MED ORDER — IRON SUCROSE 20 MG/ML IV SOLN
200.0000 mg | Freq: Once | INTRAVENOUS | Status: AC
  Administered 2023-12-03: 200 mg via INTRAVENOUS
  Filled 2023-12-03: qty 10

## 2023-12-03 MED ORDER — LORATADINE 10 MG PO TABS
10.0000 mg | ORAL_TABLET | Freq: Once | ORAL | Status: AC
  Administered 2023-12-03: 10 mg via ORAL
  Filled 2023-12-03: qty 1

## 2023-12-03 NOTE — Progress Notes (Signed)
 Diagnosis: Iron Deficiency Anemia  Provider:  Chilton Greathouse MD  Procedure: IV Push  IV Type: Peripheral, IV Location: R Antecubital  Venofer (Iron Sucrose), Dose: 200 mg  Post Infusion IV Care: Observation period completed and Peripheral IV Discontinued  Discharge: Condition: Good, Destination: Home . AVS Declined  Performed by:  Rico Ala, LPN

## 2023-12-05 ENCOUNTER — Ambulatory Visit

## 2023-12-05 VITALS — BP 119/79 | HR 69 | Temp 99.0°F | Resp 16 | Ht 66.0 in | Wt 189.9 lb

## 2023-12-05 DIAGNOSIS — D500 Iron deficiency anemia secondary to blood loss (chronic): Secondary | ICD-10-CM

## 2023-12-05 DIAGNOSIS — N924 Excessive bleeding in the premenopausal period: Secondary | ICD-10-CM | POA: Diagnosis not present

## 2023-12-05 MED ORDER — ACETAMINOPHEN 325 MG PO TABS: 650.0000 mg | ORAL_TABLET | Freq: Once | ORAL | Status: DC

## 2023-12-05 MED ORDER — IRON SUCROSE 20 MG/ML IV SOLN
200.0000 mg | Freq: Once | INTRAVENOUS | Status: AC
  Administered 2023-12-05: 200 mg via INTRAVENOUS
  Filled 2023-12-05: qty 10

## 2023-12-05 MED ORDER — LORATADINE 10 MG PO TABS: 10.0000 mg | ORAL_TABLET | Freq: Once | ORAL | Status: DC

## 2023-12-05 NOTE — Progress Notes (Signed)
 Diagnosis: Iron  Deficiency Anemia  Provider:  Praveen Mannam MD  Procedure: IV Push  IV Type: Peripheral, IV Location: L Antecubital  Venofer  (Iron  Sucrose), Dose: 200 mg  Post Infusion IV Care: Observation period completed and Peripheral IV Discontinued  Discharge: Condition: Good, Destination: Home . AVS Declined  Performed by:  Leita FORBES Miles, LPN

## 2023-12-09 ENCOUNTER — Ambulatory Visit

## 2023-12-09 VITALS — BP 116/73 | HR 68 | Temp 98.9°F | Resp 16 | Ht 66.0 in | Wt 188.6 lb

## 2023-12-09 DIAGNOSIS — D500 Iron deficiency anemia secondary to blood loss (chronic): Secondary | ICD-10-CM | POA: Diagnosis not present

## 2023-12-09 MED ORDER — LORATADINE 10 MG PO TABS: 10.0000 mg | ORAL_TABLET | Freq: Once | ORAL | Status: DC

## 2023-12-09 MED ORDER — IRON SUCROSE 20 MG/ML IV SOLN
200.0000 mg | Freq: Once | INTRAVENOUS | Status: AC
  Administered 2023-12-09: 200 mg via INTRAVENOUS

## 2023-12-09 MED ORDER — ACETAMINOPHEN 325 MG PO TABS: 650.0000 mg | ORAL_TABLET | Freq: Once | ORAL | Status: DC

## 2023-12-09 NOTE — Progress Notes (Signed)
 Diagnosis: Iron Deficiency Anemia  Provider:  Praveen Mannam MD  Procedure: IV Push  IV Type: Peripheral, IV Location: L Antecubital  Venofer (Iron Sucrose), Dose: 200 mg  Post Infusion IV Care: Observation period completed and Peripheral IV Discontinued Pt requested to only wait 15 minutes   Discharge: Condition: Good, Destination: Home . AVS Declined  Performed by:  Leita FORBES Miles, LPN

## 2023-12-11 ENCOUNTER — Ambulatory Visit

## 2023-12-13 ENCOUNTER — Ambulatory Visit

## 2023-12-13 VITALS — BP 122/78 | HR 56 | Temp 98.7°F | Resp 18 | Ht 66.0 in | Wt 190.0 lb

## 2023-12-13 DIAGNOSIS — D500 Iron deficiency anemia secondary to blood loss (chronic): Secondary | ICD-10-CM | POA: Diagnosis not present

## 2023-12-13 MED ORDER — SODIUM CHLORIDE 0.9 % IV BOLUS
250.0000 mL | Freq: Once | INTRAVENOUS | Status: DC
  Filled 2023-12-13: qty 250

## 2023-12-13 MED ORDER — ACETAMINOPHEN 325 MG PO TABS: 650.0000 mg | ORAL_TABLET | Freq: Once | ORAL | Status: DC

## 2023-12-13 MED ORDER — LORATADINE 10 MG PO TABS: 10.0000 mg | ORAL_TABLET | Freq: Once | ORAL | Status: DC

## 2023-12-13 MED ORDER — IRON SUCROSE 20 MG/ML IV SOLN
200.0000 mg | Freq: Once | INTRAVENOUS | Status: AC
  Administered 2023-12-13: 200 mg via INTRAVENOUS
  Filled 2023-12-13: qty 10

## 2023-12-13 NOTE — Progress Notes (Signed)
 Diagnosis: Iron Deficiency Anemia  Provider:  Chilton Greathouse MD  Procedure: IV Push  IV Type: Peripheral, IV Location: L Antecubital  Venofer (Iron Sucrose), Dose: 200 mg  Post Infusion IV Care: Patient declined observation and Peripheral IV Discontinued  Discharge: Condition: Good, Destination: Home . AVS Declined  Performed by:  Adriana Mccallum, RN

## 2023-12-16 ENCOUNTER — Ambulatory Visit (INDEPENDENT_AMBULATORY_CARE_PROVIDER_SITE_OTHER)

## 2023-12-16 VITALS — BP 119/70 | HR 69 | Temp 98.6°F | Resp 16 | Ht 66.0 in | Wt 188.0 lb

## 2023-12-16 DIAGNOSIS — D500 Iron deficiency anemia secondary to blood loss (chronic): Secondary | ICD-10-CM

## 2023-12-16 MED ORDER — ACETAMINOPHEN 325 MG PO TABS: 650.0000 mg | ORAL_TABLET | Freq: Once | ORAL | Status: DC

## 2023-12-16 MED ORDER — LORATADINE 10 MG PO TABS: 10.0000 mg | ORAL_TABLET | Freq: Once | ORAL | Status: DC

## 2023-12-16 MED ORDER — IRON SUCROSE 20 MG/ML IV SOLN
200.0000 mg | Freq: Once | INTRAVENOUS | Status: AC
  Administered 2023-12-16: 200 mg via INTRAVENOUS
  Filled 2023-12-16: qty 10

## 2023-12-16 NOTE — Progress Notes (Signed)
 Diagnosis: Acute Anemia  Provider:  Chilton Greathouse MD  Procedure: IV Push  IV Type: Peripheral, IV Location: L Antecubital  Venofer (Iron Sucrose), Dose: 200 mg  Post Infusion IV Care: Patient declined observation and Peripheral IV Discontinued  Discharge: Condition: Good, Destination: Home . AVS Declined  Performed by:  Nat Math, RN

## 2024-01-07 ENCOUNTER — Other Ambulatory Visit: Payer: Self-pay

## 2024-01-07 DIAGNOSIS — D500 Iron deficiency anemia secondary to blood loss (chronic): Secondary | ICD-10-CM

## 2024-01-08 ENCOUNTER — Inpatient Hospital Stay: Attending: Hematology

## 2024-01-10 ENCOUNTER — Inpatient Hospital Stay

## 2024-01-15 ENCOUNTER — Inpatient Hospital Stay: Attending: Hematology

## 2024-01-15 DIAGNOSIS — D509 Iron deficiency anemia, unspecified: Secondary | ICD-10-CM | POA: Insufficient documentation

## 2024-01-17 ENCOUNTER — Telehealth: Payer: Self-pay | Admitting: Hematology

## 2024-01-21 ENCOUNTER — Inpatient Hospital Stay

## 2024-01-21 DIAGNOSIS — D500 Iron deficiency anemia secondary to blood loss (chronic): Secondary | ICD-10-CM

## 2024-01-21 DIAGNOSIS — D509 Iron deficiency anemia, unspecified: Secondary | ICD-10-CM | POA: Diagnosis not present

## 2024-01-21 LAB — CMP (CANCER CENTER ONLY)
ALT: 10 U/L (ref 0–44)
AST: 16 U/L (ref 15–41)
Albumin: 4.5 g/dL (ref 3.5–5.0)
Alkaline Phosphatase: 47 U/L (ref 38–126)
Anion gap: 10 (ref 5–15)
BUN: 11 mg/dL (ref 6–20)
CO2: 27 mmol/L (ref 22–32)
Calcium: 9.4 mg/dL (ref 8.9–10.3)
Chloride: 101 mmol/L (ref 98–111)
Creatinine: 0.84 mg/dL (ref 0.44–1.00)
GFR, Estimated: >60 mL/min
Glucose, Bld: 120 mg/dL — ABNORMAL HIGH (ref 70–99)
Potassium: 4.1 mmol/L (ref 3.5–5.1)
Sodium: 139 mmol/L (ref 135–145)
Total Bilirubin: 0.3 mg/dL (ref 0.0–1.2)
Total Protein: 7.1 g/dL (ref 6.5–8.1)

## 2024-01-21 LAB — CBC WITH DIFFERENTIAL (CANCER CENTER ONLY)
Abs Immature Granulocytes: 0.02 K/uL (ref 0.00–0.07)
Basophils Absolute: 0.1 K/uL (ref 0.0–0.1)
Basophils Relative: 1 %
Eosinophils Absolute: 0.1 K/uL (ref 0.0–0.5)
Eosinophils Relative: 1 %
HCT: 41 % (ref 36.0–46.0)
Hemoglobin: 13.3 g/dL (ref 12.0–15.0)
Immature Granulocytes: 0 %
Lymphocytes Relative: 21 %
Lymphs Abs: 1.6 K/uL (ref 0.7–4.0)
MCH: 27 pg (ref 26.0–34.0)
MCHC: 32.4 g/dL (ref 30.0–36.0)
MCV: 83.3 fL (ref 80.0–100.0)
Monocytes Absolute: 0.4 K/uL (ref 0.1–1.0)
Monocytes Relative: 5 %
Neutro Abs: 5.7 K/uL (ref 1.7–7.7)
Neutrophils Relative %: 72 %
Platelet Count: 345 K/uL (ref 150–400)
RBC: 4.92 MIL/uL (ref 3.87–5.11)
RDW: 15.9 % — ABNORMAL HIGH (ref 11.5–15.5)
WBC Count: 7.8 K/uL (ref 4.0–10.5)
nRBC: 0 % (ref 0.0–0.2)

## 2024-01-21 LAB — FERRITIN: Ferritin: 123 ng/mL (ref 11–307)

## 2024-01-21 LAB — IRON AND IRON BINDING CAPACITY (CC-WL,HP ONLY)
Iron: 92 ug/dL (ref 28–170)
Saturation Ratios: 24 % (ref 10.4–31.8)
TIBC: 393 ug/dL (ref 250–450)
UIBC: 301 ug/dL

## 2024-02-10 ENCOUNTER — Ambulatory Visit: Admitting: General Practice

## 2024-02-18 ENCOUNTER — Inpatient Hospital Stay

## 2024-02-18 ENCOUNTER — Inpatient Hospital Stay: Admitting: Hematology

## 2024-02-19 ENCOUNTER — Other Ambulatory Visit: Payer: Self-pay

## 2024-02-19 DIAGNOSIS — D500 Iron deficiency anemia secondary to blood loss (chronic): Secondary | ICD-10-CM

## 2024-02-20 ENCOUNTER — Inpatient Hospital Stay: Attending: Hematology

## 2024-02-20 ENCOUNTER — Inpatient Hospital Stay: Attending: Hematology | Admitting: Hematology

## 2024-02-20 ENCOUNTER — Ambulatory Visit: Admitting: General Practice

## 2024-02-20 DIAGNOSIS — D500 Iron deficiency anemia secondary to blood loss (chronic): Secondary | ICD-10-CM

## 2024-02-20 NOTE — Assessment & Plan Note (Signed)
 Chronic iron  deficiency anemia due to heavy menstrual bleeding.   -ab on November 06, 2023 showed hemoglobin level 11.6 g/dL, Ferritin level is 4.4 ng/mL, indicating low iron  stores. She experiences fatigue and cold intolerance but is able to function in daily activities. Oral iron  supplements cause constipation so she does not tolerate -she received iv iron  venofer  200mg X5 in 11/2023, and responded well, anemia resolved.

## 2024-02-21 ENCOUNTER — Ambulatory Visit: Admitting: General Practice

## 2024-02-22 NOTE — Progress Notes (Signed)
 " [facility]   [phone] [phone]) [phone]   Clinic Follow up Note   Patient Care Team: Vincente Shivers, NP as PCP - General (General Practice) Dannielle Bouchard, DO as Consulting Physician (Obstetrics and Gynecology) 02/22/2024  I connected with Samantha Trujillo on 02/20/2024 at  4:00 PM EST by telephone and verified that I am speaking with the correct person using two identifiers.   I discussed the limitations, risks, security and privacy concerns of performing an evaluation and management service by telephone and the availability of in person appointments. I also discussed with the patient that there may be a patient responsible charge related to this service. The patient expressed understanding and agreed to proceed.   Patient's location:  Home  Provider's location:  Office    CHIEF COMPLAINT: f/u IDA    CURRENT THERAPY: iv iron  as needed   Oncology history Iron  deficiency anemia due to chronic blood loss Chronic iron  deficiency anemia due to heavy menstrual bleeding.   -ab on November 06, 2023 showed hemoglobin level 11.6 g/dL, Ferritin level is 4.4 ng/mL, indicating low iron  stores. She experiences fatigue and cold intolerance but is able to function in daily activities. Oral iron  supplements cause constipation so she does not tolerate -she received iv iron  venofer  200mg X5 in 11/2023, and responded well, anemia resolved.    Assessment & Plan Iron  deficiency anemia due to chronic blood loss Iron  deficiency anemia is well controlled following five doses of intravenous iron , with improved hemoglobin and minimal symptoms. Ongoing menorrhagia remains the likely etiology, increasing risk for recurrent iron  deficiency. She is undergoing gynecologic evaluation. Oral iron  is not tolerated due to constipation. Coordination with her primary care physician for ongoing monitoring is appropriate. - Recommended iron  studies every 2-3 months with her primary care  physician to monitor for recurrent iron  deficiency. - Advised to contact hematology if iron  levels decline, particularly if ferritin is less than 10 ng/mL, to arrange intravenous iron  infusion. - Discussed future option of higher-dose intravenous iron  infusions (up to 400 mg per session) to reduce infusion frequency and facility fees; explained potential for longer infusion times and possible insurance limitations. - Instructed to request the highest possible dose if future infusions are required to minimize frequency and cost. - Coordinated with her primary care physician for monitoring and ordering of iron  studies and infusions as needed. - Agreed to cancel today's hematology lab appointment and follow up with her primary care physician.   Plan - Reviewed, anemia resolved after IV iron . - I recommend continue monitoring CBC and iron  level every 3 months, and consider IV iron  Venofer  300 over 400 mg infusions if ferritin less than 10 - She would like to follow-up with her PCP for lab, she knows to call us  if she needs IV iron . -copy her PCP Shivers Vincente   Discussed the use of AI scribe software for clinical note transcription with the patient, who gave verbal consent to proceed.  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  She completed five doses of IV iron  in October 2025. Labs on January 21, 2024 showed hemoglobin improved from 11.6 to 13.3 g/dL. She has minimal fatigue, which she attributes to lifestyle, and denies other anemia symptoms.  She continues to have heavy but now more regular menses and identifies this as the source of her iron  loss. She is followed by OB/GYN for menorrhagia.  She develops constipation with oral iron  and prefers IV iron  when treatment is needed.     REVIEW OF SYSTEMS:   Constitutional: Denies  fevers, chills or abnormal weight loss Eyes: Denies blurriness of vision Ears, nose, mouth, throat, and face: Denies mucositis or sore throat Respiratory: Denies cough, dyspnea or wheezes Cardiovascular: Denies palpitation, chest discomfort or lower extremity swelling Gastrointestinal:  Denies nausea, heartburn or change in bowel habits Skin: Denies abnormal skin rashes Lymphatics: Denies new lymphadenopathy or easy bruising Neurological:Denies numbness, tingling or new weaknesses Behavioral/Psych: Mood is stable, no new changes  All other systems were reviewed with the patient and are negative.  MEDICAL HISTORY:  Past Medical History:  Diagnosis Date   Medical history non-contributory    Migraines    Vaginal Pap smear, abnormal    Varicose veins     SURGICAL HISTORY: Past Surgical History:  Procedure Laterality Date   CESAREAN SECTION WITH BILATERAL TUBAL LIGATION Bilateral 02/19/2022   Procedure: PRIMARY CESAREAN SECTION WITH BILATERAL TUBAL LIGATION;  Surgeon: Dannielle Bouchard, DO;  Location: [facility];  Service: Obstetrics;  Laterality: Bilateral;   DILATION AND CURETTAGE OF UTERUS N/A 11/23/2020   Procedure: DILATATION AND CURETTAGE;  Surgeon: Curlene Agent, MD;  Location: [facility] OR;  Service: Gynecology;  Laterality: N/A;   NO PAST SURGERIES     OPERATIVE ULTRASOUND N/A 11/23/2020   Procedure: OPERATIVE ULTRASOUND;  Surgeon: Curlene Agent, MD;  Location: [facility] OR;  Service: Gynecology;  Laterality: N/A;    I have reviewed the social history and family history with the patient and they are unchanged from previous note.  ALLERGIES:  is allergic to augmentin [amoxicillin-pot clavulanate], covid-19 mrna vacc (moderna), codeine, and zithromax [azithromycin].  MEDICATIONS:  Current Outpatient Medications  Medication Sig Dispense Refill   cetirizine  (ZYRTEC ) 10 MG tablet Take 1 tablet (10 mg total) by mouth daily. 30 tablet 2   fluticasone  (FLONASE ) 50 MCG/ACT nasal spray Place 2  sprays into both nostrils daily. 16 g 2   Prenatal Vit-Fe Fumarate-FA (PRENATAL MULTIVITAMIN) TABS tablet Take 1 tablet by mouth daily at 12 noon.     No current facility-administered medications for this visit.    PHYSICAL EXAMINATION: Not performed   LABORATORY DATA:  I have reviewed the data as listed    Latest Ref Rng & Units 01/21/2024    2:58 PM 11/06/2023    8:37 AM 02/24/2022    9:50 AM  CBC  WBC 4.0 - 10.5 K/uL 7.8  5.6  8.4   Hemoglobin 12.0 - 15.0 g/dL 86.6  88.3  89.0   Hematocrit 36.0 - 46.0 % 41.0  35.9  32.7   Platelets 150 - 400 K/uL 345  350.0  348         Latest Ref Rng & Units 01/21/2024    2:58 PM 11/06/2023    8:37 AM 02/24/2022    9:50 AM  CMP  Glucose 70 - 99 mg/dL 879  88  94   BUN 6 - 20 mg/dL 11  13  6    Creatinine 0.44 - 1.00 mg/dL 9.15  9.21  9.29   Sodium 135 - 145 mmol/L 139  140  139   Potassium 3.5 - 5.1 mmol/L 4.1  4.8  4.2   Chloride 98 - 111 mmol/L 101  103  107   CO2 22 - 32 mmol/L 27  31  26    Calcium 8.9 - 10.3 mg/dL 9.4  8.9  8.5   Total Protein 6.5 - 8.1 g/dL  7.1  6.2  5.4   Total Bilirubin 0.0 - 1.2 mg/dL 0.3  0.5  0.6   Alkaline Phos 38 - 126 U/L 47  44  98   AST 15 - 41 U/L 16  11  20    ALT 0 - 44 U/L 10  8  22        RADIOGRAPHIC STUDIES: I have personally reviewed the radiological images as listed and agreed with the findings in the report. No results found.     I discussed the assessment and treatment plan with the patient. The patient was provided an opportunity to ask questions and all were answered. The patient agreed with the plan and demonstrated an understanding of the instructions.   The patient was advised to call back or seek an in-person evaluation if the symptoms worsen or if the condition fails to improve as anticipated.  I provided 15 minutes of non face-to-face telephone visit time during this encounter, including review of chart and various tests results, discussions about plan of care and coordination of  care plan.    Onita Mattock, MD 02/20/2024     "

## 2024-03-02 ENCOUNTER — Ambulatory Visit: Admitting: General Practice

## 2024-03-10 ENCOUNTER — Ambulatory Visit: Admitting: General Practice

## 2024-11-06 ENCOUNTER — Encounter: Admitting: General Practice
